# Patient Record
Sex: Female | Born: 1965 | Race: Black or African American | Hispanic: No | Smoking: Never smoker
Health system: Southern US, Community
[De-identification: ages and names within clinical notes are randomized; demographics above are authoritative.]

## PROBLEM LIST (undated history)

## (undated) DIAGNOSIS — F419 Anxiety disorder, unspecified: Secondary | ICD-10-CM

## (undated) DIAGNOSIS — M797 Fibromyalgia: Secondary | ICD-10-CM

## (undated) DIAGNOSIS — R7303 Prediabetes: Secondary | ICD-10-CM

## (undated) DIAGNOSIS — Z8669 Personal history of other diseases of the nervous system and sense organs: Secondary | ICD-10-CM

## (undated) DIAGNOSIS — F329 Major depressive disorder, single episode, unspecified: Secondary | ICD-10-CM

## (undated) DIAGNOSIS — T4145XA Adverse effect of unspecified anesthetic, initial encounter: Secondary | ICD-10-CM

## (undated) DIAGNOSIS — T8859XA Other complications of anesthesia, initial encounter: Secondary | ICD-10-CM

## (undated) DIAGNOSIS — F32A Depression, unspecified: Secondary | ICD-10-CM

## (undated) DIAGNOSIS — R51 Headache: Secondary | ICD-10-CM

## (undated) HISTORY — DX: Personal history of other diseases of the nervous system and sense organs: Z86.69

## (undated) HISTORY — PX: TUBAL LIGATION: SHX77

## (undated) HISTORY — DX: Fibromyalgia: M79.7

## (undated) HISTORY — DX: Depression, unspecified: F32.A

## (undated) HISTORY — DX: Anxiety disorder, unspecified: F41.9

## (undated) HISTORY — DX: Prediabetes: R73.03

## (undated) HISTORY — DX: Major depressive disorder, single episode, unspecified: F32.9

---
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
## 2000-06-10 ENCOUNTER — Other Ambulatory Visit: Admission: RE | Admit: 2000-06-10 | Discharge: 2000-06-10 | Payer: Self-pay | Admitting: *Deleted

## 2003-06-04 ENCOUNTER — Emergency Department (HOSPITAL_COMMUNITY): Admission: EM | Admit: 2003-06-04 | Discharge: 2003-06-04 | Payer: Self-pay | Admitting: Emergency Medicine

## 2004-01-27 ENCOUNTER — Other Ambulatory Visit: Admission: RE | Admit: 2004-01-27 | Discharge: 2004-01-27 | Payer: Self-pay | Admitting: Obstetrics and Gynecology

## 2004-02-17 ENCOUNTER — Ambulatory Visit (HOSPITAL_COMMUNITY): Admission: RE | Admit: 2004-02-17 | Discharge: 2004-02-17 | Payer: Self-pay | Admitting: Obstetrics and Gynecology

## 2005-02-09 ENCOUNTER — Ambulatory Visit (HOSPITAL_COMMUNITY): Admission: RE | Admit: 2005-02-09 | Discharge: 2005-02-09 | Payer: Self-pay | Admitting: Obstetrics and Gynecology

## 2006-11-01 ENCOUNTER — Ambulatory Visit (HOSPITAL_COMMUNITY): Admission: RE | Admit: 2006-11-01 | Discharge: 2006-11-01 | Payer: Self-pay | Admitting: Obstetrics and Gynecology

## 2006-11-05 ENCOUNTER — Encounter: Admission: RE | Admit: 2006-11-05 | Discharge: 2006-11-05 | Payer: Self-pay | Admitting: Obstetrics and Gynecology

## 2006-12-23 ENCOUNTER — Ambulatory Visit: Payer: Self-pay | Admitting: Internal Medicine

## 2006-12-26 ENCOUNTER — Ambulatory Visit: Payer: Self-pay | Admitting: Internal Medicine

## 2007-10-15 IMAGING — US US ABDOMEN COMPLETE
1 series · 14 of 25 positions shown · non-contrast
Comparison: none

CLINICAL DATA: Pelvic pain with generalized abdominal pain.  Family history of ovarian, pancreatic, and cervical cancer. 
 ABDOMEN ULTRASOUND:
TECHNIQUE: Complete abdominal ultrasound examination was performed including evaluation of the liver, gallbladder, bile ducts, pancreas, kidneys, spleen, IVC, and abdominal aorta.
 There is no evidence of gallstones or biliary ductal dilatation.  Gallbladder wall thickness 2 mm.  Common bile duct 5 mm.  The liver is within normal limits in echogenicity, and no focal liver lesions are seen.  The visualized portions of the IVC and pancreas are unremarkable.  Spleen 7.8 cm long.  There is no evidence of splenomegaly.  Right kidney 10.2 cm long, left kidney 11.1 cm long.  The kidneys are unremarkable, and there is no evidence of hydronephrosis.    Either slight right upper pole right renal caliectasis or parapelvic subcentimeter cyst is noted.  The abdominal aorta is nondilated with maximum diameter of 2.1 cm.

[Series 1: us abdomen complete · 0.27mm/px · 14 of 91 slices shown]
[im 1/91]
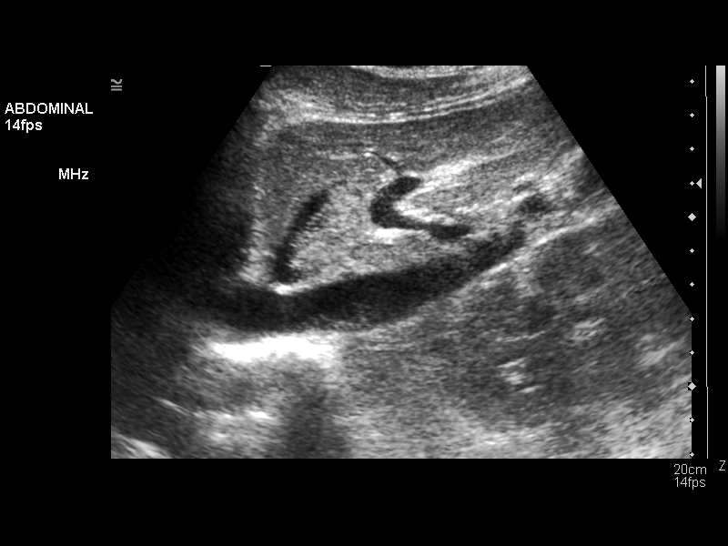
[im 8/91]
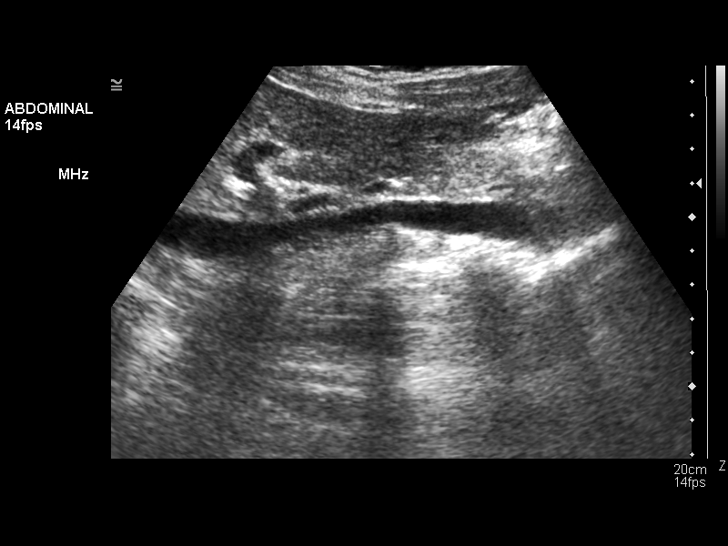
[im 16/91]
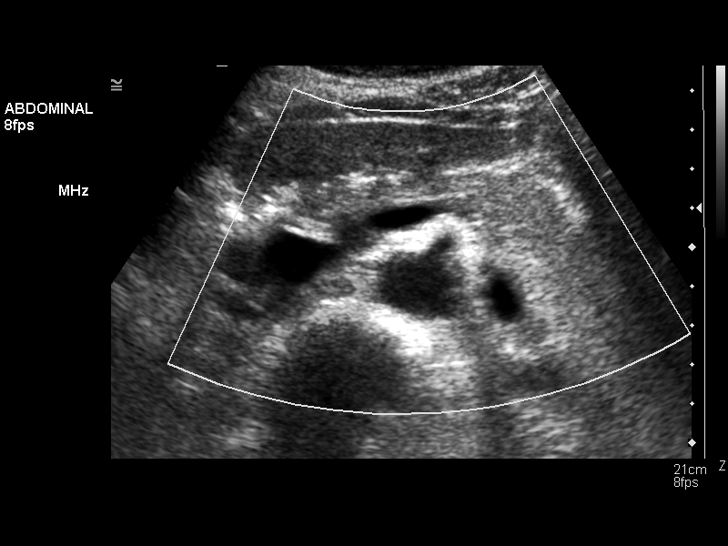
[im 23/91]
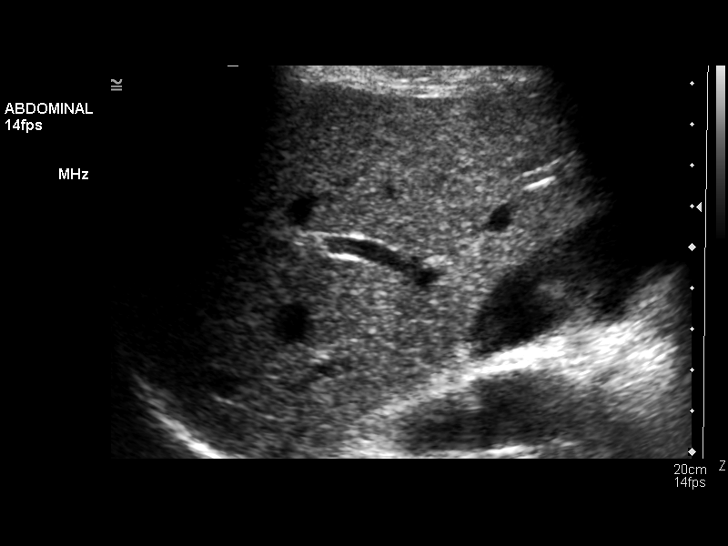
[im 31/91]
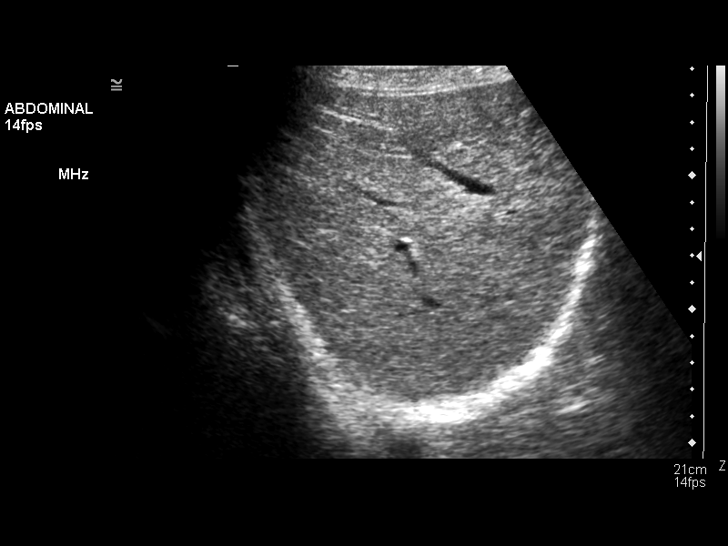
[im 34/91]
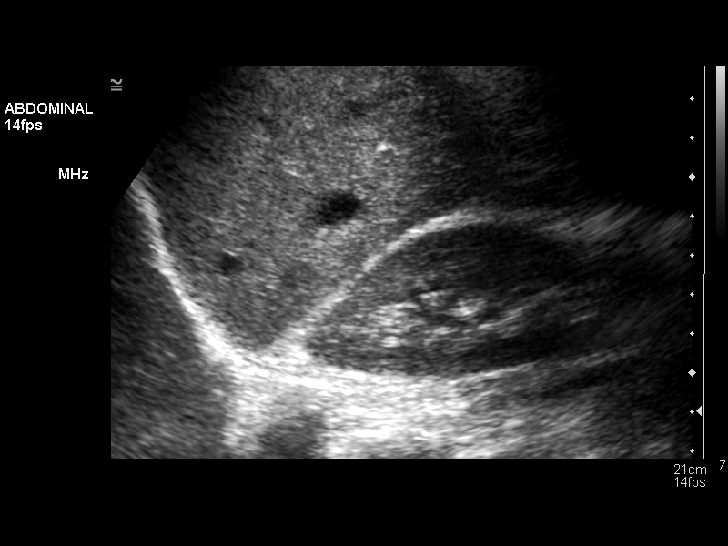
[im 42/91]
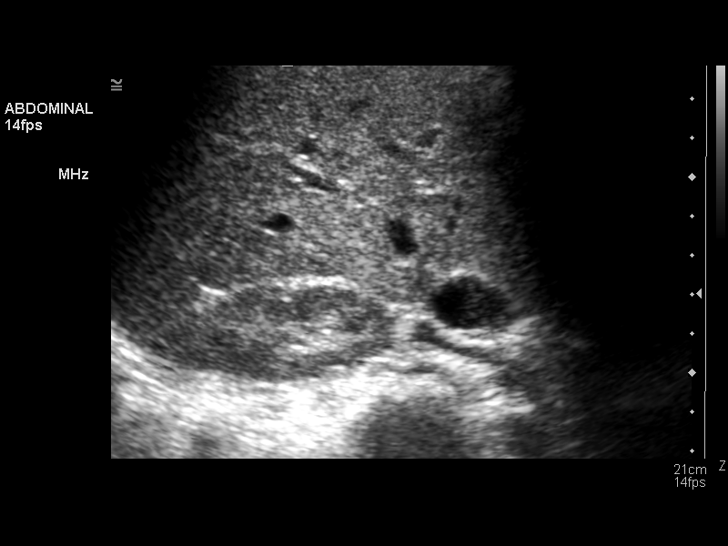
[im 49/91]
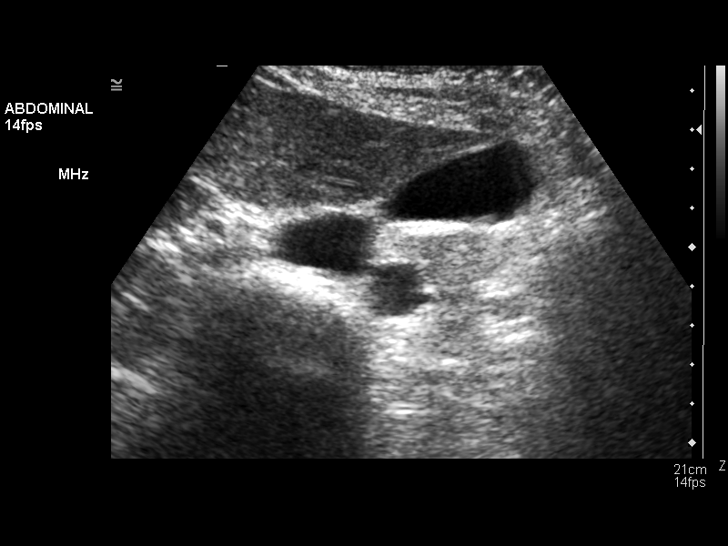
[im 57/91]
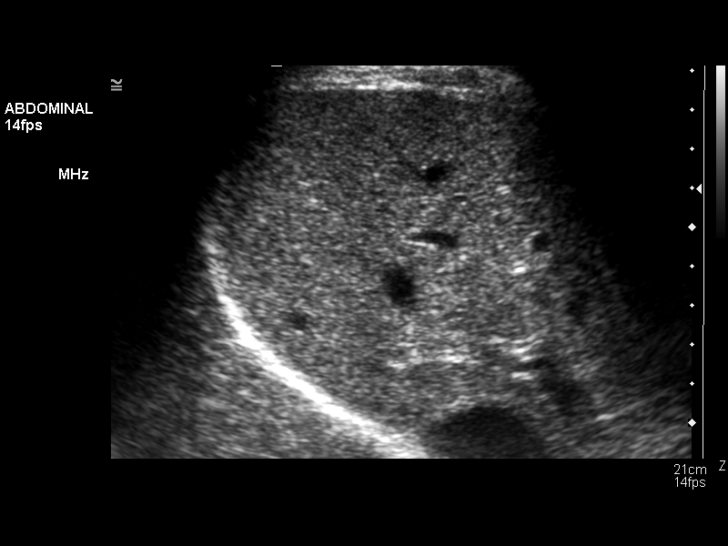
[im 61/91]
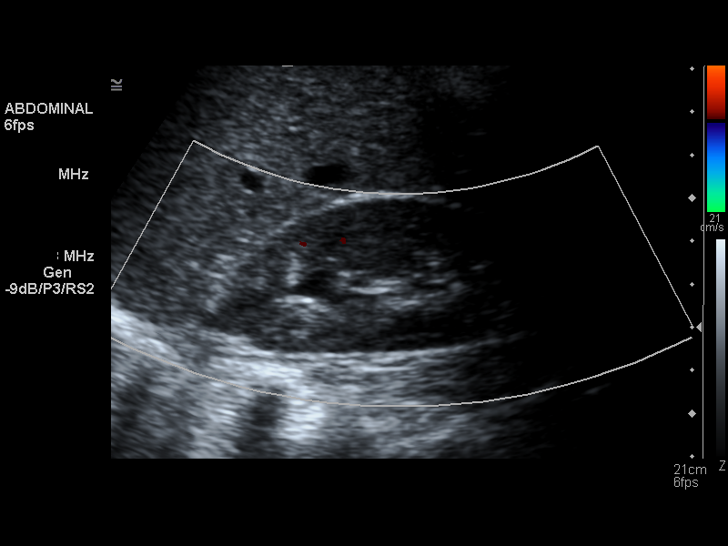
[im 68/91]
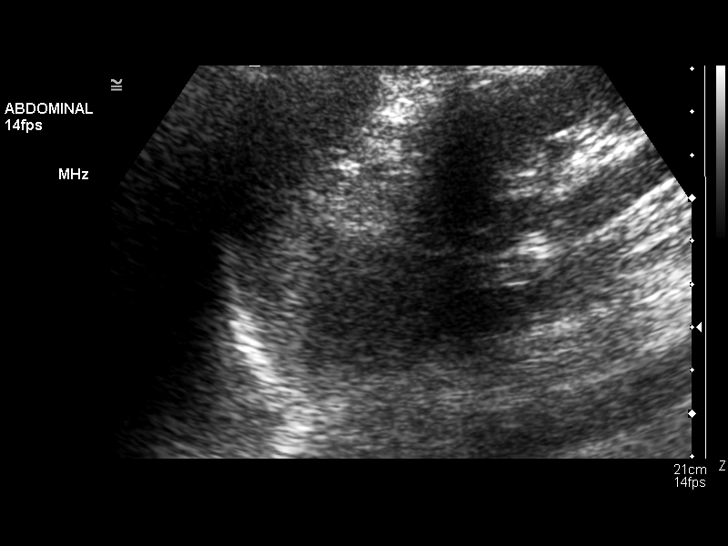
[im 76/91]
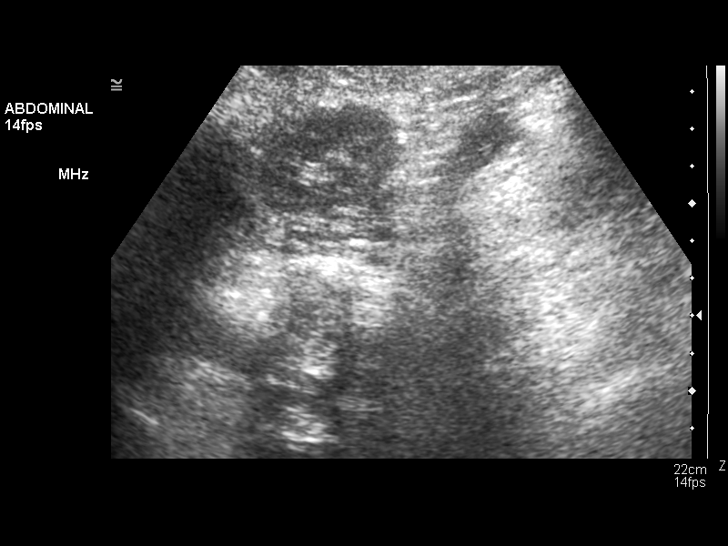
[im 83/91]
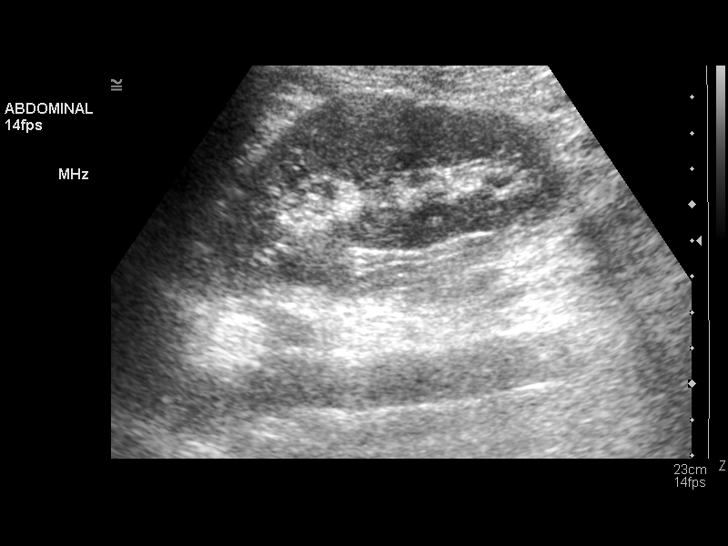
[im 91/91]
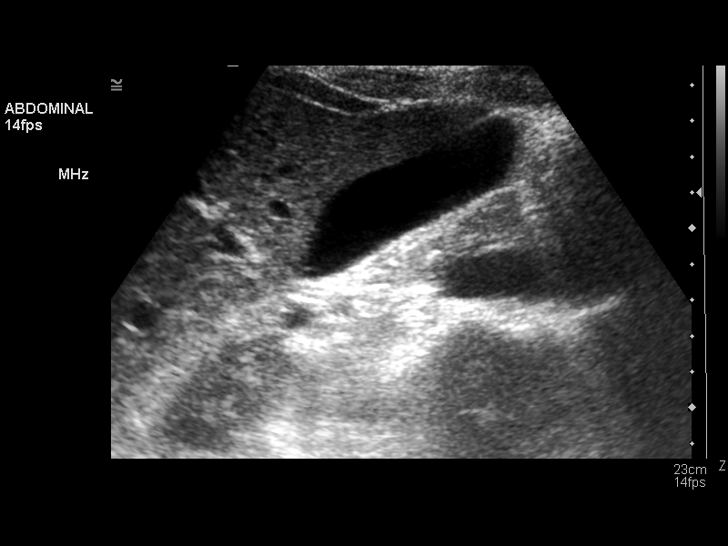

[14 of 25 positions shown; findings below may reference images not displayed]

IMPRESSION: 1. Slight upper pole right renal caliectasis or subcentimeter parapelvic renal cyst.
 2.  Otherwise negative abdominal ultrasound.

## 2007-10-15 IMAGING — US US PELVIS COMPLETE
1 series · 13 of 25 positions shown · non-contrast
Comparison: [HOSPITAL] pelvic ultrasound, 02/17/04.

CLINICAL DATA: Pelvic pain.  Heavy long menstruation.    
 TRANSABDOMINAL AND TRANSVAGINAL PELVIC ULTRASOUND:
TECHNIQUE: Both transabdominal and transvaginal ultrasound examinations of the pelvis were performed including evaluation of the uterus, ovaries, adnexal regions, and pelvic cul-de-sac.

[Series 1: us pelvis complete · 0.22mm/px · 13 of 79 slices shown]
[im 1/79]
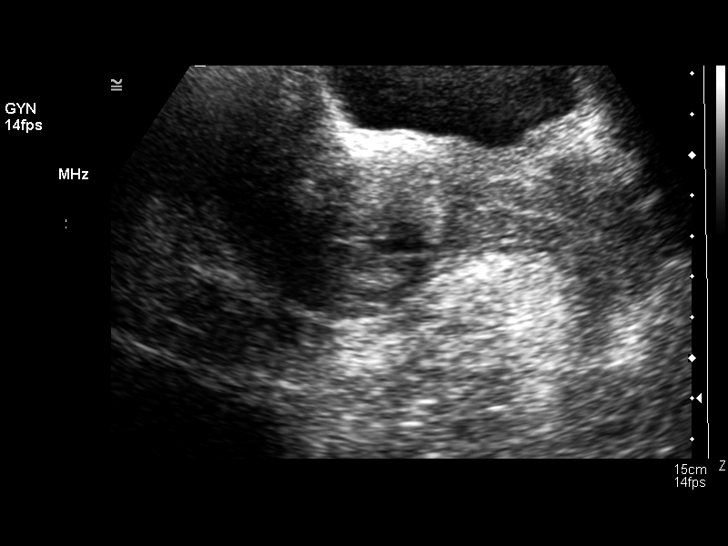
[im 7/79]
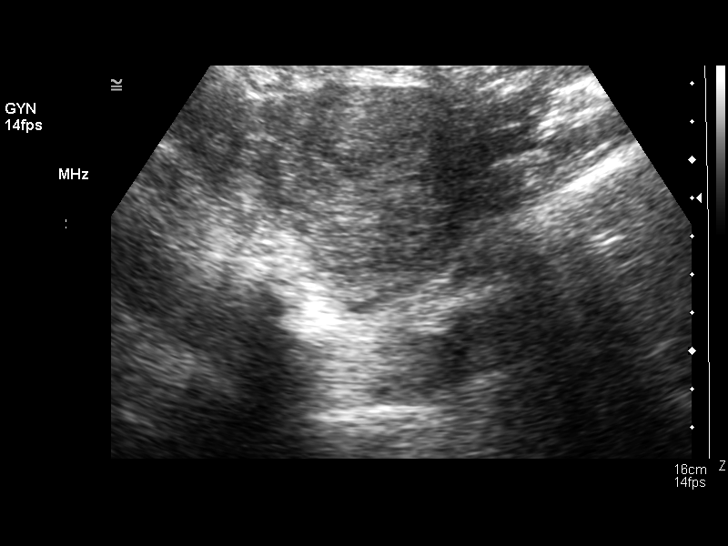
[im 14/79]
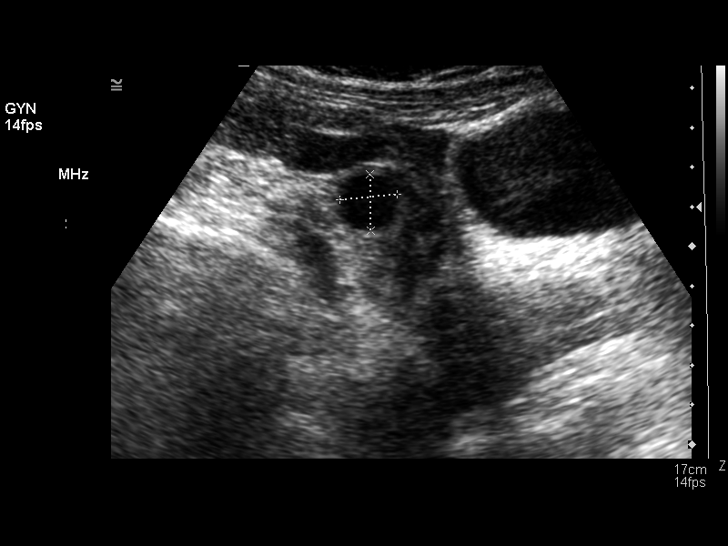
[im 20/79]
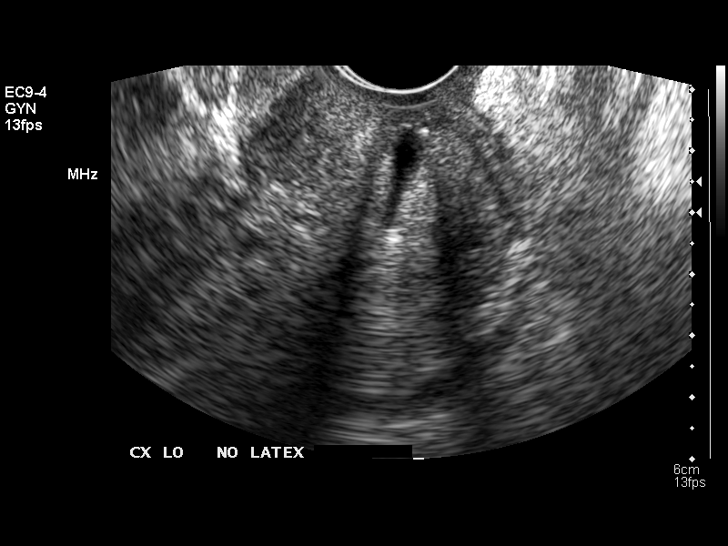
[im 27/79]
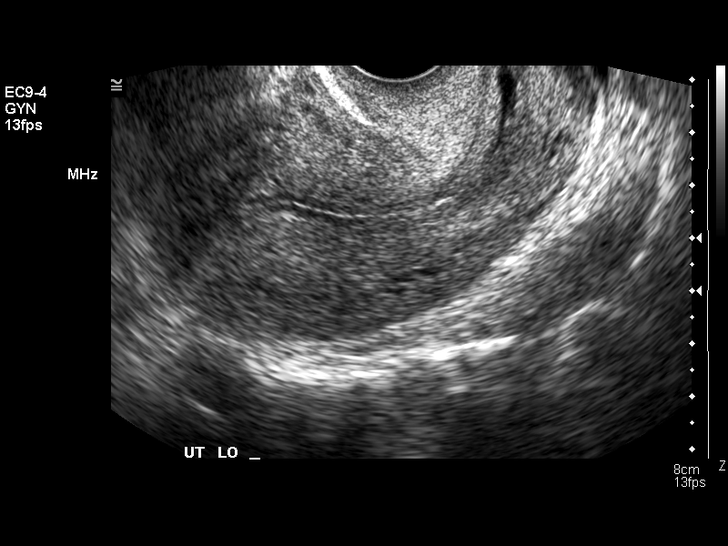
[im 33/79]
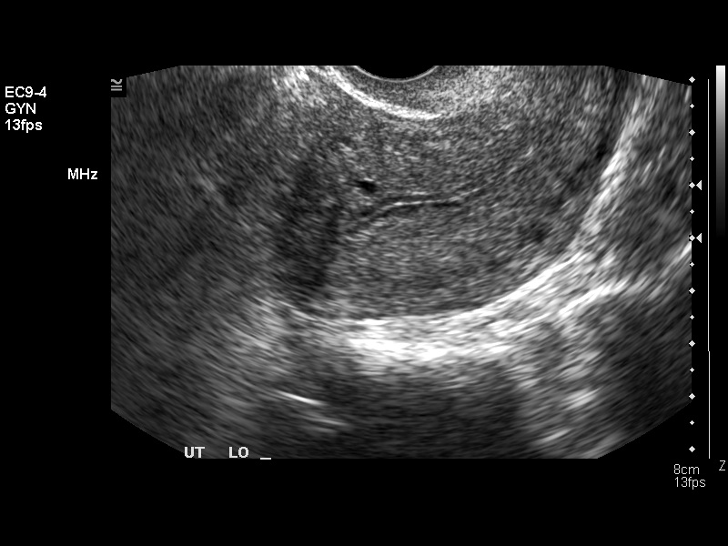
[im 40/79]
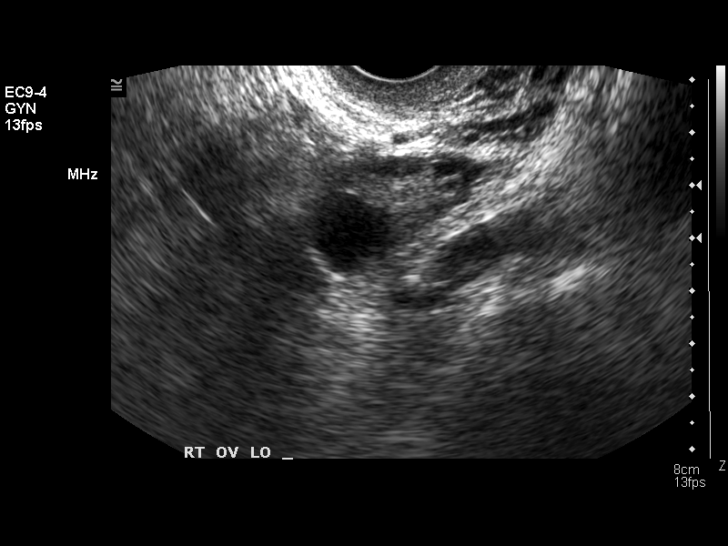
[im 46/79]
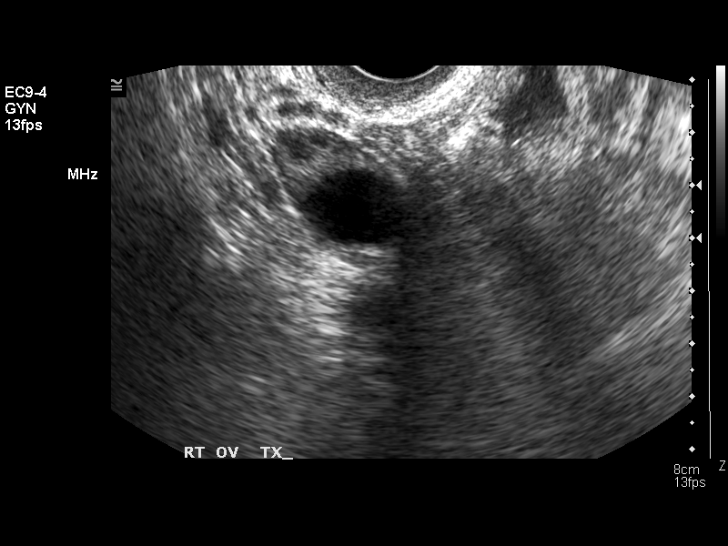
[im 53/79]
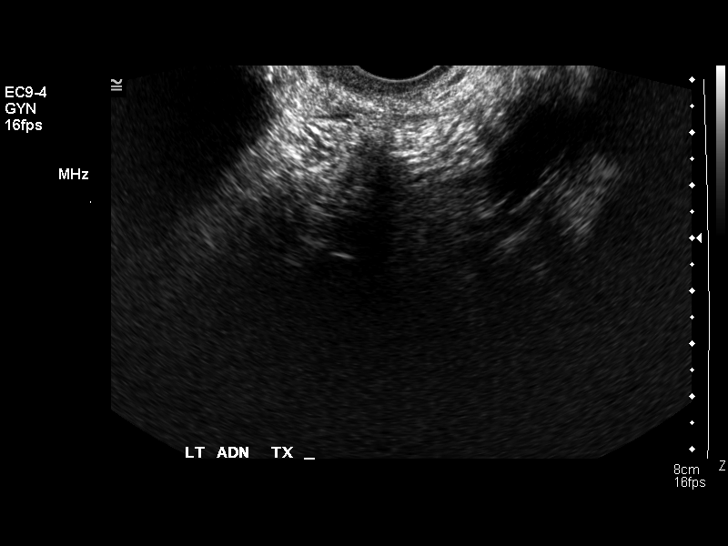
[im 59/79]
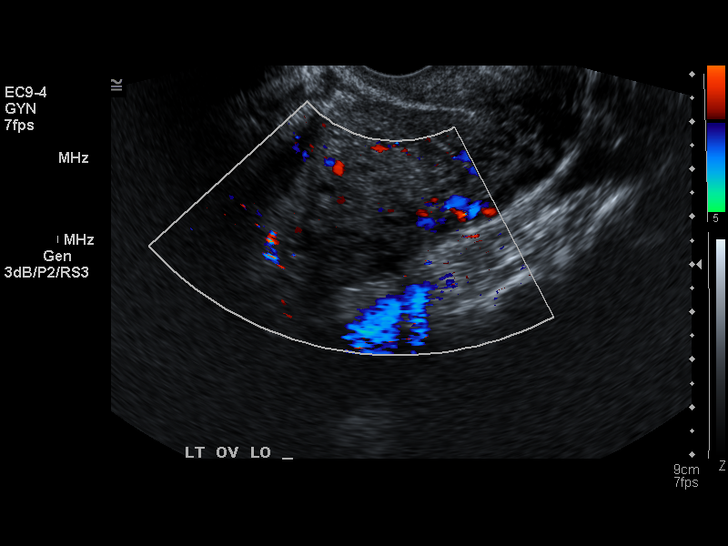
[im 66/79]
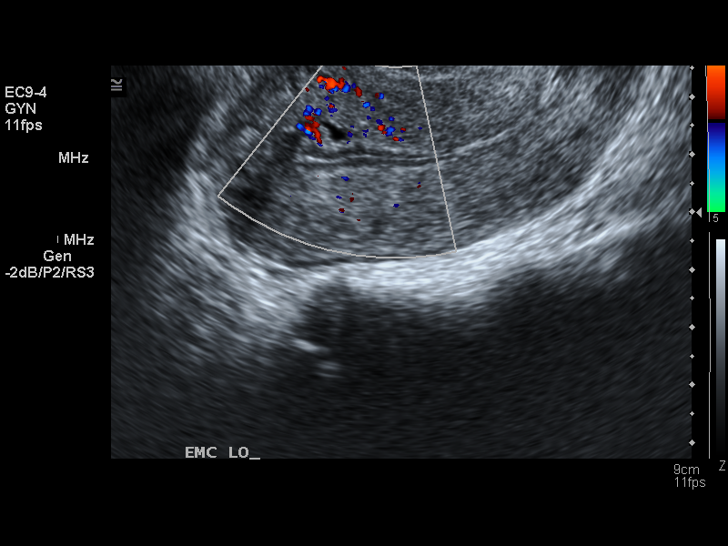
[im 72/79]
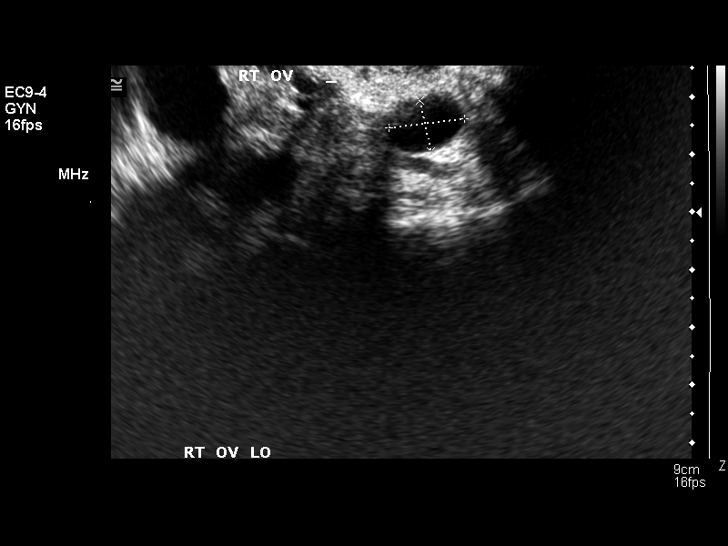
[im 79/79]
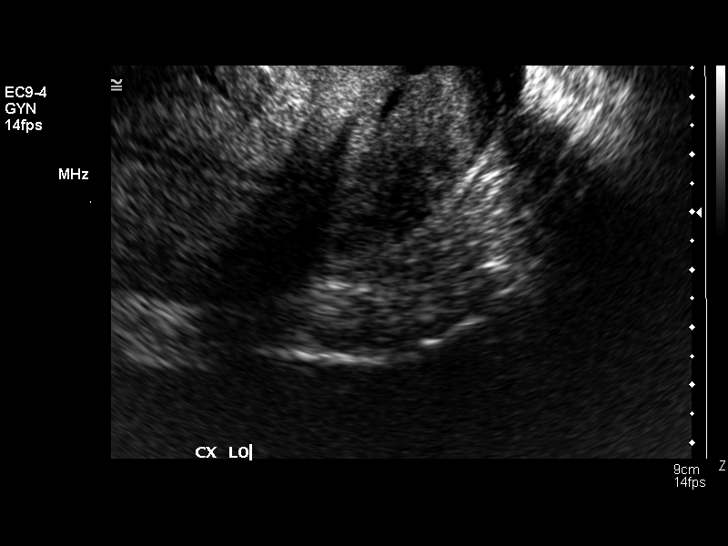

[13 of 25 positions shown; findings below may reference images not displayed]

FINDINGS: The uterus remains normal in size measuring 8.7 cm long x 4.4 cm AP x 5.5 cm wide with fundal endometrial stripe thickness of 9 mm normal for a menstruating female.  Interval 5 mm benign appearing cyst is seen at the anterior junctional zone interposed between endometrium and submucosal myometrium.  Tiny cul de sac free fluid transvaginally only favors physiologic ovulatory fluid.  The bilateral ovaries are normal in size with the right measuring 3.7 cm long x 2 cm AP x 2.4 cm wide and the left 2.5 cm long x 1.3 cm AP x 1.6 cm wide.  Previous 1.2 cm right paraovarian cyst is essentially stable currently measuring 1.3 cm long x 0.9 cm AP x 1 cm wide.  Interval 1.8 cm simple right ovarian cyst is also noted.
IMPRESSION: 1.  Interval 5 mm likely benign cyst at the anterior uterine junctional zone.
 2.  Likely physiologic free fluid seen transvaginally only.
 3.  Stable 1.3 cm paraovarian cyst with interval 2 cm simple right ovarian cyst. 
 4.  Otherwise negative.

## 2007-11-06 ENCOUNTER — Ambulatory Visit (HOSPITAL_COMMUNITY): Admission: RE | Admit: 2007-11-06 | Discharge: 2007-11-06 | Payer: Self-pay | Admitting: Obstetrics and Gynecology

## 2008-07-28 ENCOUNTER — Ambulatory Visit: Payer: Self-pay | Admitting: Family Medicine

## 2008-07-30 ENCOUNTER — Ambulatory Visit: Payer: Self-pay | Admitting: Family Medicine

## 2008-08-04 ENCOUNTER — Encounter: Admission: RE | Admit: 2008-08-04 | Discharge: 2008-08-04 | Payer: Self-pay | Admitting: Family Medicine

## 2008-08-11 ENCOUNTER — Ambulatory Visit: Payer: Self-pay | Admitting: Family Medicine

## 2008-08-18 ENCOUNTER — Ambulatory Visit: Payer: Self-pay | Admitting: Family Medicine

## 2008-09-08 ENCOUNTER — Ambulatory Visit: Payer: Self-pay | Admitting: Family Medicine

## 2008-10-25 ENCOUNTER — Ambulatory Visit: Payer: Self-pay | Admitting: Family Medicine

## 2008-12-07 ENCOUNTER — Ambulatory Visit (HOSPITAL_COMMUNITY): Admission: RE | Admit: 2008-12-07 | Discharge: 2008-12-07 | Payer: Self-pay | Admitting: Obstetrics and Gynecology

## 2009-01-27 ENCOUNTER — Ambulatory Visit: Payer: Self-pay | Admitting: Family Medicine

## 2009-01-31 ENCOUNTER — Ambulatory Visit: Payer: Self-pay | Admitting: Family Medicine

## 2009-03-12 ENCOUNTER — Emergency Department (HOSPITAL_COMMUNITY): Admission: EM | Admit: 2009-03-12 | Discharge: 2009-03-12 | Payer: Self-pay | Admitting: Emergency Medicine

## 2009-07-05 ENCOUNTER — Ambulatory Visit: Payer: Self-pay | Admitting: Family Medicine

## 2009-12-14 ENCOUNTER — Ambulatory Visit: Payer: Self-pay | Admitting: Physician Assistant

## 2010-02-28 ENCOUNTER — Ambulatory Visit: Payer: Self-pay | Admitting: Physician Assistant

## 2010-12-08 ENCOUNTER — Ambulatory Visit
Admission: RE | Admit: 2010-12-08 | Discharge: 2010-12-08 | Payer: Self-pay | Source: Home / Self Care | Attending: Family Medicine | Admitting: Family Medicine

## 2010-12-17 ENCOUNTER — Encounter: Payer: Self-pay | Admitting: Obstetrics and Gynecology

## 2011-02-07 ENCOUNTER — Ambulatory Visit (INDEPENDENT_AMBULATORY_CARE_PROVIDER_SITE_OTHER): Payer: Medicaid Other | Admitting: Family Medicine

## 2011-02-07 DIAGNOSIS — B009 Herpesviral infection, unspecified: Secondary | ICD-10-CM

## 2011-02-07 DIAGNOSIS — IMO0001 Reserved for inherently not codable concepts without codable children: Secondary | ICD-10-CM

## 2011-04-13 NOTE — Assessment & Plan Note (Signed)
[facility]                         GASTROENTEROLOGY OFFICE NOTE   NAME:[name], [name]                       MRN:          [id number]  DATE:12/23/2006                            DOB:          [birth year]    Ms. [name] is a very nice 45 year old African American female with  epigastric of 4 months duration. The pain started during Thanksgiving.  She woke up with pain radiating to the back and around her costal  margin. Also, into the chest. She borrowed Nexium from her cousin and  she had some relief of the abdominal pain. Nevertheless, the pain has  continued on-and-off. It is definitely worse with eating and with  stress. She usually does not eat the first part of the day, but as the  day progresses, pain evolves. The patient does not take any NSAIDs or  aspirin. She used to take a lot of aspirin, but no longer. Her  grandmother had pancreatic cancer. Mrs. [name] had an upper abdominal  ultrasound, which was negative. There is no family history of  gallbladder disease.   MEDICATIONS:  Topamax 100 mg at bedtime. This was started to control her  headaches. She is quite satisfied with the medication.   Her weight has been stable. She denies vomiting, nausea or dysphagia.  Her bowels habits have been regular. She had a colonoscopy in 2000.   PAST MEDICAL HISTORY:  Significant for:  1. Chronic headaches.  2. Depression.  3. She had tubal ligation in 1998.   FAMILY HISTORY:  Mother has uterine cancer. Grandmother had pancreatic  cancer. Uncle had prostatic cancer.   SOCIAL HISTORY:  The patient is divorced with 2 children, 10 and 29. 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 She also works full-time. She  does not smoke and drinks alcohol socially.   REVIEW OF SYSTEMS:  Positive for allergies, back pain, vision changes.   PHYSICAL EXAMINATION:  Blood pressure 110/76, pulse 84 and weight 137  pounds. She was alert, oriented and in no  distress.  Her sclerae were nonicteric.  NECK: Supple without adenopathy.  LUNGS: Clear to auscultation.  COR: Normal S1, normal S2.  ABDOMEN: Soft, relaxed with normoactive bowel sounds. Liver edge was at  costal margin. There was tenderness in the mid-epigastrium, above the  umbilicus and subxiphoid area, which did not seem to radiate to left  upper quadrant. There was no costovertebral angle tenderness  bilaterally. Lower abdomen was unremarkable.  EXTREMITIES: No edema.  RECTAL: Examination not done today.   IMPRESSION:  A 45 year old female with now 107-month history of persistent  epigastric pain with some question of partial relief with Nexium, who  has negative upper abdominal ultrasound with no evidence of  cholelithiasis. I think her symptoms are suggestive of either peptic  ulcer disease or irritable bowel syndrome with gastric spasm. She may  have a non-ulcerative dyspepsia,  H-pylori gastritis.   PLAN:  1. Upper endoscopy has been scheduled.  2. Nexium 40 mg daily. Samples given as well as prescription.      Depending on the response and the results of  the upper endoscopy,      we may need further investigation, specifically with a CT scan of      the abdomen.     Hedwig Morton. Juanda Chance, MD  Electronically Signed    DMB/MedQ  DD: 12/23/2006  DT: 12/23/2006  Job #: [id number]   cc:   Maxie Better, M.D.

## 2011-04-16 ENCOUNTER — Other Ambulatory Visit: Payer: Self-pay | Admitting: Family Medicine

## 2011-04-16 NOTE — Telephone Encounter (Signed)
ambien 10mg  #20 with 6 refills and tramadol 50mg  #30 with 6 refills

## 2011-04-16 NOTE — Telephone Encounter (Signed)
ok renew Ambien and tramadol. Give first 6 months renewals

## 2011-04-20 ENCOUNTER — Emergency Department (HOSPITAL_COMMUNITY): Payer: Self-pay

## 2011-04-20 ENCOUNTER — Emergency Department (HOSPITAL_COMMUNITY)
Admission: EM | Admit: 2011-04-20 | Discharge: 2011-04-20 | Disposition: A | Discharge status: Home / Self Care | Payer: Self-pay | Attending: Emergency Medicine | Admitting: Emergency Medicine

## 2011-04-20 DIAGNOSIS — N201 Calculus of ureter: Secondary | ICD-10-CM | POA: Insufficient documentation

## 2011-04-20 DIAGNOSIS — N898 Other specified noninflammatory disorders of vagina: Secondary | ICD-10-CM | POA: Insufficient documentation

## 2011-04-20 DIAGNOSIS — N949 Unspecified condition associated with female genital organs and menstrual cycle: Secondary | ICD-10-CM | POA: Insufficient documentation

## 2011-04-20 DIAGNOSIS — R109 Unspecified abdominal pain: Secondary | ICD-10-CM | POA: Insufficient documentation

## 2011-04-20 LAB — DIFFERENTIAL
Basophils Absolute: 0 10*3/uL (ref 0.0–0.1)
Basophils Relative: 1 % (ref 0–1)
Eosinophils Absolute: 0.2 10*3/uL (ref 0.0–0.7)
Eosinophils Relative: 3 % (ref 0–5)
Lymphocytes Relative: 28 % (ref 12–46)
Lymphs Abs: 1.7 10*3/uL (ref 0.7–4.0)
Monocytes Absolute: 0.5 10*3/uL (ref 0.1–1.0)
Monocytes Relative: 8 % (ref 3–12)
Neutro Abs: 3.8 10*3/uL (ref 1.7–7.7)
Neutrophils Relative %: 61 % (ref 43–77)

## 2011-04-20 LAB — BASIC METABOLIC PANEL
BUN: 19 mg/dL (ref 6–23)
CO2: 23 mEq/L (ref 19–32)
Calcium: 9.4 mg/dL (ref 8.4–10.5)
Chloride: 106 mEq/L (ref 96–112)
Creatinine, Ser: 0.77 mg/dL (ref 0.4–1.2)
GFR calc Af Amer: >60 mL/min (ref >60)
GFR calc non Af Amer: >60 mL/min (ref >60)
Glucose, Bld: 89 mg/dL (ref 70–99)
Potassium: 3.6 mEq/L (ref 3.5–5.1)
Sodium: 139 mEq/L (ref 135–145)

## 2011-04-20 LAB — URINALYSIS, ROUTINE W REFLEX MICROSCOPIC
Bilirubin Urine: NEGATIVE
Glucose, UA: NEGATIVE mg/dL
Nitrite: NEGATIVE
Protein, ur: NEGATIVE mg/dL
Specific Gravity, Urine: 1.025 (ref 1.005–1.030)
Urobilinogen, UA: 0.2 mg/dL (ref 0.0–1.0)
pH: 5.5 (ref 5.0–8.0)

## 2011-04-20 LAB — CBC
HCT: 38.6 % (ref 36.0–46.0)
Hemoglobin: 12.6 g/dL (ref 12.0–15.0)
MCH: 27.5 pg (ref 26.0–34.0)
MCHC: 32.6 g/dL (ref 30.0–36.0)
MCV: 84.1 fL (ref 78.0–100.0)
Platelets: 196 10*3/uL (ref 150–400)
RBC: 4.59 MIL/uL (ref 3.87–5.11)
RDW: 14.2 % (ref 11.5–15.5)
WBC: 6.3 10*3/uL (ref 4.0–10.5)

## 2011-04-20 LAB — POCT PREGNANCY, URINE: Preg Test, Ur: NEGATIVE

## 2011-04-20 LAB — URINE MICROSCOPIC-ADD ON

## 2011-04-20 LAB — WET PREP, GENITAL
Trich, Wet Prep: NONE SEEN
Yeast Wet Prep HPF POC: NONE SEEN

## 2011-04-21 LAB — GC/CHLAMYDIA PROBE AMP, GENITAL
Chlamydia, DNA Probe: NEGATIVE
GC Probe Amp, Genital: NEGATIVE

## 2011-09-03 ENCOUNTER — Encounter: Payer: Self-pay | Admitting: Family Medicine

## 2011-09-03 ENCOUNTER — Other Ambulatory Visit: Payer: Self-pay | Admitting: Family Medicine

## 2011-09-03 DIAGNOSIS — Z1231 Encounter for screening mammogram for malignant neoplasm of breast: Secondary | ICD-10-CM

## 2011-09-04 DIAGNOSIS — Z0279 Encounter for issue of other medical certificate: Secondary | ICD-10-CM

## 2011-09-10 ENCOUNTER — Ambulatory Visit
Admission: RE | Admit: 2011-09-10 | Discharge: 2011-09-10 | Disposition: A | Discharge status: Home / Self Care | Payer: BC Managed Care – PPO | Source: Ambulatory Visit | Attending: Family Medicine | Admitting: Family Medicine

## 2011-09-10 ENCOUNTER — Encounter: Payer: Self-pay | Admitting: Family Medicine

## 2011-09-10 ENCOUNTER — Ambulatory Visit (INDEPENDENT_AMBULATORY_CARE_PROVIDER_SITE_OTHER): Payer: BC Managed Care – PPO | Admitting: Family Medicine

## 2011-09-10 VITALS — BP 120/80 | HR 95 | Wt 170.0 lb

## 2011-09-10 DIAGNOSIS — Z566 Other physical and mental strain related to work: Secondary | ICD-10-CM

## 2011-09-10 DIAGNOSIS — Z1231 Encounter for screening mammogram for malignant neoplasm of breast: Secondary | ICD-10-CM

## 2011-09-10 DIAGNOSIS — Z23 Encounter for immunization: Secondary | ICD-10-CM

## 2011-09-10 DIAGNOSIS — M797 Fibromyalgia: Secondary | ICD-10-CM

## 2011-09-10 DIAGNOSIS — Z5689 Other problems related to employment: Secondary | ICD-10-CM

## 2011-09-10 DIAGNOSIS — F32A Depression, unspecified: Secondary | ICD-10-CM

## 2011-09-10 DIAGNOSIS — F329 Major depressive disorder, single episode, unspecified: Secondary | ICD-10-CM

## 2011-09-10 DIAGNOSIS — IMO0001 Reserved for inherently not codable concepts without codable children: Secondary | ICD-10-CM

## 2011-09-10 MED ORDER — CYCLOBENZAPRINE HCL 10 MG PO TABS: 10.0000 mg | ORAL_TABLET | Freq: Every day | ORAL | Status: DC

## 2011-09-10 MED ORDER — DULOXETINE HCL 60 MG PO CPEP: 60.0000 mg | ORAL_CAPSULE | Freq: Every day | ORAL | Status: DC

## 2011-09-10 NOTE — Progress Notes (Signed)
  Subjective:    Patient ID: [name], female    DOB: [birth year], 45 y.o.   MRN: [id number]  HPI She is here for a recheck. She continues on medications listed in the chart. She has stopped Cymbalta and states that she has noted no change in her fibromyalgia symptoms. She still does have difficulty controlling her emotions and feels that she does at times have depression. She also continues on her Valtrex for treatment of herpes genitalis and she has not had an outbreak recently. She does have difficulty dealing with stress and her emotions. She especially notes this at work and sometimes does feel quite overwhelmed.   Review of Systems     Objective:   Physical Exam Alert and in no distress with appropriate affect.       Assessment & Plan:   1. Fibromyalgia muscle pain   2. Depression (emotion)   3. Stress at work    we discussed her fibromyalgia in terms of her medications. I will place her back on Cymbalta increasing it to 60 mg. Recheck in approximately 6 weeks and go to 90 and then possibly to the 120. Discussed stress and stress management with her. Encouraged her to change how she responds to stressful situations. Recommended counseling or doing some individual reading about this.

## 2011-09-10 NOTE — Patient Instructions (Signed)
Think about what I said about stress and stress management. See you back here in 6 weeks.

## 2011-09-26 ENCOUNTER — Telehealth: Payer: Self-pay | Admitting: Family Medicine

## 2011-09-26 NOTE — Telephone Encounter (Signed)
A one-week sample of Cymbalta was given to the patient.

## 2011-09-26 NOTE — Telephone Encounter (Signed)
Pt was called and informed that samples are available for her to pick up

## 2011-10-15 ENCOUNTER — Other Ambulatory Visit: Payer: Self-pay | Admitting: Family Medicine

## 2011-10-15 NOTE — Telephone Encounter (Signed)
Is this okay?

## 2011-10-15 NOTE — Telephone Encounter (Signed)
Phone in the Ambien

## 2011-10-15 NOTE — Telephone Encounter (Signed)
Called in ambien to pharmacy

## 2011-10-24 ENCOUNTER — Encounter: Payer: Self-pay | Admitting: Family Medicine

## 2011-10-24 ENCOUNTER — Ambulatory Visit (INDEPENDENT_AMBULATORY_CARE_PROVIDER_SITE_OTHER): Payer: BC Managed Care – PPO | Admitting: Family Medicine

## 2011-10-24 VITALS — BP 118/72 | HR 60 | Wt 172.0 lb

## 2011-10-24 DIAGNOSIS — IMO0001 Reserved for inherently not codable concepts without codable children: Secondary | ICD-10-CM

## 2011-10-24 DIAGNOSIS — M797 Fibromyalgia: Secondary | ICD-10-CM

## 2011-10-24 DIAGNOSIS — F329 Major depressive disorder, single episode, unspecified: Secondary | ICD-10-CM

## 2011-10-24 DIAGNOSIS — F32A Depression, unspecified: Secondary | ICD-10-CM

## 2011-10-24 MED ORDER — DULOXETINE HCL 60 MG PO CPEP: 60.0000 mg | ORAL_CAPSULE | Freq: Every day | ORAL | Status: DC

## 2011-10-24 MED ORDER — DULOXETINE HCL 30 MG PO CPEP: 30.0000 mg | ORAL_CAPSULE | Freq: Every day | ORAL | Status: DC

## 2011-10-24 NOTE — Patient Instructions (Signed)
Let me know if the cost of the Cymbalta is prohibitive.

## 2011-10-24 NOTE — Progress Notes (Signed)
  Subjective:    Patient ID: [name], female    DOB: [birth year], 45 y.o.   MRN: [id number]  HPI She is here for recheck. She states that she still is on an emotional roller coaster. She is concerned that this could be related to her cycles. She has been irregular for the last year sometimes having 2 menses per month and sometimes missing. Her bleeding can also be erratic. Her fibromyalgia pains do get worse during her cycle.   Review of Systems     Objective:   Physical Exam Alert and in no distress with appropriate affect otherwise not examined       Assessment & Plan:  Fibromyalgia. Depression. Increase Cymbalta to 90 mg. She will check on the cost this medication. May consider using Prozac to help with possible perimenopausal symptoms

## 2011-11-02 ENCOUNTER — Other Ambulatory Visit: Payer: Self-pay | Admitting: Family Medicine

## 2011-11-02 NOTE — Telephone Encounter (Signed)
Is this ok?

## 2011-11-21 ENCOUNTER — Ambulatory Visit (INDEPENDENT_AMBULATORY_CARE_PROVIDER_SITE_OTHER): Payer: BC Managed Care – PPO | Admitting: Family Medicine

## 2011-11-21 ENCOUNTER — Encounter: Payer: Self-pay | Admitting: Family Medicine

## 2011-11-21 VITALS — BP 110/70 | HR 111 | Wt 170.0 lb

## 2011-11-21 DIAGNOSIS — IMO0001 Reserved for inherently not codable concepts without codable children: Secondary | ICD-10-CM

## 2011-11-21 DIAGNOSIS — M797 Fibromyalgia: Secondary | ICD-10-CM

## 2011-11-21 MED ORDER — VENLAFAXINE HCL ER 37.5 MG PO CP24: 37.5000 mg | ORAL_CAPSULE | Freq: Every day | ORAL | Status: DC

## 2011-11-21 MED ORDER — VENLAFAXINE HCL 75 MG PO TABS: 75.0000 mg | ORAL_TABLET | Freq: Two times a day (BID) | ORAL | Status: DC

## 2011-11-21 NOTE — Progress Notes (Signed)
  Subjective:    Patient ID: [name], female    DOB: 11/23/1966, 45 y.o.   MRN: [id number]  HPI She is here for consultation. She would like to switch from Cymbalta to Effexor mainly due to cost. She has only been on the Cymbalta for several weeks and has seen very little improvement. She also notes that her menses are becoming more sporadic and irregular as well as decrease bleeding. She continues on medications listed in the chart.   Review of Systems     Objective:   Physical Exam Alert and in no distress otherwise not examined       Assessment & Plan:  Fibromyalgia. Probable perimenopausal I will switch her to Effexor. She will start on 37.5 mg, increasing to 75 twice a day. Recheck with me in several months. Also discussed the perimenopausal symptoms she is having. No therapy at this time

## 2011-11-21 NOTE — Patient Instructions (Signed)
Start with a lower dose of Effexor for the first week and then the higher dose.

## 2011-11-22 ENCOUNTER — Ambulatory Visit: Payer: BC Managed Care – PPO | Admitting: Family Medicine

## 2011-11-27 LAB — HM PAP SMEAR: HM Pap smear: NORMAL

## 2012-01-23 ENCOUNTER — Encounter: Payer: Self-pay | Admitting: Family Medicine

## 2012-01-23 ENCOUNTER — Ambulatory Visit (INDEPENDENT_AMBULATORY_CARE_PROVIDER_SITE_OTHER): Payer: BC Managed Care – PPO | Admitting: Family Medicine

## 2012-01-23 VITALS — BP 112/70 | HR 100 | Ht 63.0 in | Wt 166.0 lb

## 2012-01-23 DIAGNOSIS — M797 Fibromyalgia: Secondary | ICD-10-CM

## 2012-01-23 DIAGNOSIS — IMO0001 Reserved for inherently not codable concepts without codable children: Secondary | ICD-10-CM

## 2012-01-23 MED ORDER — TRAMADOL HCL 50 MG PO TABS: 50.0000 mg | ORAL_TABLET | Freq: Four times a day (QID) | ORAL | Status: DC | PRN

## 2012-01-23 MED ORDER — VENLAFAXINE HCL 100 MG PO TABS: 100.0000 mg | ORAL_TABLET | Freq: Two times a day (BID) | ORAL | Status: DC

## 2012-01-23 MED ORDER — MODAFINIL 100 MG PO TABS: 100.0000 mg | ORAL_TABLET | Freq: Every day | ORAL | Status: DC

## 2012-01-23 NOTE — Patient Instructions (Signed)
I'll call you after I talk to your rheumatologist

## 2012-01-23 NOTE — Progress Notes (Signed)
  Subjective:    Patient ID: [name], female    DOB: 09-07-1966, 46 y.o.   MRN: [id number]  HPI She is here for followup. She was switched to Effexor to help cut down on the cost of her medications. She states she is feeling roughly 80% better. She was given phentermine by her gynecologist initially for weight loss but found that it did help with her energy and with her focus. She would like to be continued on this. She otherwise has no particular concerns or questions.    Review of Systems     Objective:   Physical Exam  Alert and in no distress with appropriate affect      Assessment & Plan:   1. Fibromyalgia muscle pain  traMADol (ULTRAM) 50 MG tablet, venlafaxine (EFFEXOR) 100 MG tablet   I discussed the use of phentermine with her rheumatologist. Dr. Corliss Skains does not use this medication for energy and did recommend Provigil. I will prescribe this. The patient will decide whether to get it filled since it apparently is not a covered medication under her insurance plan. I will also increase her Effexor to 100 mg twice a day.

## 2012-02-11 ENCOUNTER — Other Ambulatory Visit: Payer: Self-pay | Admitting: Family Medicine

## 2012-02-11 NOTE — Telephone Encounter (Signed)
Is this ok?

## 2012-02-11 NOTE — Telephone Encounter (Signed)
This is okay.

## 2012-03-07 ENCOUNTER — Other Ambulatory Visit: Payer: Self-pay | Admitting: Family Medicine

## 2012-03-07 NOTE — Telephone Encounter (Signed)
Called into [facility].

## 2012-03-07 NOTE — Telephone Encounter (Signed)
Call in the Flexeril 

## 2012-03-07 NOTE — Telephone Encounter (Signed)
Is this okay to refill, was rx'd by you at 09/10/11 appt with 5 refills. Thanks.

## 2012-03-11 ENCOUNTER — Ambulatory Visit (INDEPENDENT_AMBULATORY_CARE_PROVIDER_SITE_OTHER): Payer: BC Managed Care – PPO | Admitting: Family Medicine

## 2012-03-11 ENCOUNTER — Encounter: Payer: Self-pay | Admitting: Family Medicine

## 2012-03-11 ENCOUNTER — Telehealth: Payer: Self-pay | Admitting: Internal Medicine

## 2012-03-11 VITALS — BP 110/70 | HR 78 | Wt 168.0 lb

## 2012-03-11 DIAGNOSIS — R5383 Other fatigue: Secondary | ICD-10-CM

## 2012-03-11 DIAGNOSIS — M797 Fibromyalgia: Secondary | ICD-10-CM

## 2012-03-11 DIAGNOSIS — IMO0001 Reserved for inherently not codable concepts without codable children: Secondary | ICD-10-CM

## 2012-03-11 DIAGNOSIS — D649 Anemia, unspecified: Secondary | ICD-10-CM

## 2012-03-11 DIAGNOSIS — R5381 Other malaise: Secondary | ICD-10-CM

## 2012-03-11 DIAGNOSIS — Z79899 Other long term (current) drug therapy: Secondary | ICD-10-CM

## 2012-03-11 LAB — CBC WITH DIFFERENTIAL/PLATELET
Basophils Absolute: 0 10*3/uL (ref 0.0–0.1)
Basophils Relative: 0 % (ref 0–1)
Eosinophils Absolute: 0.2 10*3/uL (ref 0.0–0.7)
Eosinophils Relative: 3 % (ref 0–5)
HCT: 37.5 % (ref 36.0–46.0)
Hemoglobin: 11.9 g/dL — ABNORMAL LOW (ref 12.0–15.0)
Lymphocytes Relative: 31 % (ref 12–46)
Lymphs Abs: 1.8 10*3/uL (ref 0.7–4.0)
MCH: 26.9 pg (ref 26.0–34.0)
MCHC: 31.7 g/dL (ref 30.0–36.0)
MCV: 84.8 fL (ref 78.0–100.0)
Monocytes Absolute: 0.4 10*3/uL (ref 0.1–1.0)
Monocytes Relative: 7 % (ref 3–12)
Neutro Abs: 3.5 10*3/uL (ref 1.7–7.7)
Neutrophils Relative %: 59 % (ref 43–77)
Platelets: 290 10*3/uL (ref 150–400)
RBC: 4.42 MIL/uL (ref 3.87–5.11)
RDW: 14.9 % (ref 11.5–15.5)
WBC: 6 10*3/uL (ref 4.0–10.5)

## 2012-03-11 LAB — LIPID PANEL
Cholesterol: 216 mg/dL — ABNORMAL HIGH (ref 0–200)
HDL: 82 mg/dL (ref >39)
LDL Cholesterol: 114 mg/dL — ABNORMAL HIGH (ref 0–99)
Total CHOL/HDL Ratio: 2.6 Ratio
Triglycerides: 99 mg/dL (ref <150)
VLDL: 20 mg/dL (ref 0–40)

## 2012-03-11 MED ORDER — VENLAFAXINE HCL ER 150 MG PO CP24: 150.0000 mg | ORAL_CAPSULE | Freq: Two times a day (BID) | ORAL | Status: DC

## 2012-03-11 MED ORDER — MODAFINIL 100 MG PO TABS: 100.0000 mg | ORAL_TABLET | Freq: Every day | ORAL | Status: DC

## 2012-03-11 NOTE — Telephone Encounter (Signed)
Called provigil 100mg  into [id number] with 5 refills

## 2012-03-11 NOTE — Patient Instructions (Addendum)
Take the Modifinil daily and let me know it does work and how long.

## 2012-03-11 NOTE — Progress Notes (Signed)
  Subjective:    Patient ID: [name], female    DOB: [birth year], 46 y.o.   MRN: [id number]  HPI She is here for consult. She continues to have difficulty with fatigue, myalgias and some shortness of breath. She has not started taking the Provigil. There was apparently a mixup with communication and she did not know that she was supposed to start this for her fatigue. She does have a history of responding to phentermine in the past for her fatigue. She continues on her Effexor but thinks it has not helped her much. She continues on the medications listed in the chart. She also has a history of anemia. She did have blood work done by her gynecologist.  Review of Systems     Objective:   Physical Exam Alert and in no distress. Blood work was reviewed from her gynecologist.       Assessment & Plan:   1. Anemia  CBC with Differential  2. Fibromyalgia muscle pain  modafinil (PROVIGIL) 100 MG tablet, venlafaxine XR (EFFEXOR-XR) 150 MG 24 hr capsule  3. Fatigue  modafinil (PROVIGIL) 100 MG tablet, venlafaxine XR (EFFEXOR-XR) 150 MG 24 hr capsule  4. Encounter for long-term (current) use of other medications  Lipid panel   I will check a CBC and also check lipids. She is to start taking the Provigil. I will also increase her Effexor to a total of 300 mg per day to see if this will help.

## 2012-03-12 NOTE — Progress Notes (Signed)
Quick Note:  The blood work is normal ______ 

## 2012-03-14 ENCOUNTER — Telehealth: Payer: Self-pay | Admitting: Family Medicine

## 2012-03-18 ENCOUNTER — Telehealth: Payer: Self-pay | Admitting: Family Medicine

## 2012-03-25 ENCOUNTER — Telehealth: Payer: Self-pay | Admitting: Family Medicine

## 2012-03-25 NOTE — Telephone Encounter (Signed)
LM

## 2012-03-27 NOTE — Telephone Encounter (Signed)
LM

## 2012-04-01 ENCOUNTER — Encounter: Payer: Self-pay | Admitting: Family Medicine

## 2012-04-01 ENCOUNTER — Ambulatory Visit (INDEPENDENT_AMBULATORY_CARE_PROVIDER_SITE_OTHER): Payer: BC Managed Care – PPO | Admitting: Family Medicine

## 2012-04-01 VITALS — BP 120/70 | HR 100 | Wt 170.0 lb

## 2012-04-01 DIAGNOSIS — F909 Attention-deficit hyperactivity disorder, unspecified type: Secondary | ICD-10-CM

## 2012-04-01 MED ORDER — AMPHETAMINE-DEXTROAMPHETAMINE 20 MG PO TABS: 20.0000 mg | ORAL_TABLET | Freq: Two times a day (BID) | ORAL | Status: DC

## 2012-04-01 NOTE — Progress Notes (Signed)
  Subjective:    Patient ID: [name], female    DOB: [birth year], 46 y.o.   MRN: [id number]  HPI She comes in today to discuss starting Adderall to help with her fatigue from her fibromyalgia. Further discussion with her indicate she indeed did try Adderall when someone gave her a sample. She noted that this helped especially with focus and attention. Further discussion with her concerning this indicates that even as a child she was noted to be very fidgety and her seat as well as talking excessively. She would wake the last minute to get any work done and usually then do fairly well on this. She did get a lot of school while in high school but apparently did well on testing and also did well in college usually when she would wake her last menstrual, shaving.   Review of Systems     Objective:   Physical Exam Alert and in no distress. ADHD testing score was 47.       Assessment & Plan:   1. ADHD (attention deficit hyperactivity disorder)  amphetamine-dextroamphetamine (ADDERALL, 20MG ,) 20 MG tablet   I discussed this with her in detail. Since her symptoms really did start at an early age and she scored 56 she certainly does have evidence of ADHD. I will place her on Adderall 10 mg twice a day which apparently did work from her previous experience. She will let me know if she indeed does need this twice per day. This may also help with her fatigue associated with fibromyalgia

## 2012-04-01 NOTE — Patient Instructions (Signed)
Me know how on the Adderall works and if you have any trouble, and off of it. You might not need to use it twice a day.

## 2012-04-11 ENCOUNTER — Other Ambulatory Visit: Payer: Self-pay | Admitting: Family Medicine

## 2012-04-11 NOTE — Telephone Encounter (Signed)
Is this ok?

## 2012-04-11 NOTE — Telephone Encounter (Signed)
Renew it

## 2012-04-11 NOTE — Telephone Encounter (Signed)
Med called in per jcl 

## 2012-05-02 ENCOUNTER — Telehealth: Payer: Self-pay | Admitting: Family Medicine

## 2012-05-02 DIAGNOSIS — F909 Attention-deficit hyperactivity disorder, unspecified type: Secondary | ICD-10-CM

## 2012-05-02 MED ORDER — AMPHETAMINE-DEXTROAMPHETAMINE 20 MG PO TABS: 20.0000 mg | ORAL_TABLET | Freq: Two times a day (BID) | ORAL | Status: DC

## 2012-05-02 NOTE — Telephone Encounter (Signed)
Adderall renewed.

## 2012-05-09 ENCOUNTER — Telehealth: Payer: Self-pay | Admitting: Family Medicine

## 2012-05-13 NOTE — Telephone Encounter (Signed)
LM

## 2012-06-16 ENCOUNTER — Other Ambulatory Visit: Payer: Self-pay | Admitting: Family Medicine

## 2012-07-22 ENCOUNTER — Other Ambulatory Visit: Payer: Self-pay | Admitting: Family Medicine

## 2012-07-22 NOTE — Telephone Encounter (Signed)
IS THIS OK 

## 2012-08-01 ENCOUNTER — Ambulatory Visit (INDEPENDENT_AMBULATORY_CARE_PROVIDER_SITE_OTHER): Payer: BC Managed Care – PPO | Admitting: Family Medicine

## 2012-08-01 ENCOUNTER — Encounter: Payer: Self-pay | Admitting: Family Medicine

## 2012-08-01 VITALS — BP 126/90 | HR 100 | Wt 168.0 lb

## 2012-08-01 DIAGNOSIS — F909 Attention-deficit hyperactivity disorder, unspecified type: Secondary | ICD-10-CM

## 2012-08-01 DIAGNOSIS — N926 Irregular menstruation, unspecified: Secondary | ICD-10-CM

## 2012-08-01 DIAGNOSIS — M797 Fibromyalgia: Secondary | ICD-10-CM

## 2012-08-01 DIAGNOSIS — IMO0001 Reserved for inherently not codable concepts without codable children: Secondary | ICD-10-CM

## 2012-08-01 MED ORDER — AMPHETAMINE-DEXTROAMPHETAMINE 20 MG PO TABS: 20.0000 mg | ORAL_TABLET | Freq: Two times a day (BID) | ORAL | Status: DC

## 2012-08-01 NOTE — Patient Instructions (Signed)
Call Dr. Andee Poles [phone]. Let me know when you have an appointment then I will send her some data on you. Also get involved with the EAP.

## 2012-08-01 NOTE — Progress Notes (Signed)
  Subjective:    Patient ID: [name], female    DOB: 1966-07-16, 46 y.o.   MRN: [id number]  HPI She has concerns over irregular menses. She has had this for well over a year. She can sometimes have a period every 10 days to 2 weeks. She complains of continued difficulty with hot flashes She does plan on following up with her gynecologist before the end of the month. For the last 7 years she has noted emotional swings, moodiness, irritability. Mainly this revolves around work. She says that anything can potentially set her off. She recently got in trouble at work. She also states that the ADD medication is not working as well as she would like. She also has underlying fibromyalgia and has been on Effexor for a long period of time.    Review of Systems     Objective:   Physical Exam Alert and in no distress and occasionally tearful.       Assessment & Plan:   1. ADHD (attention deficit hyperactivity disorder)  amphetamine-dextroamphetamine (ADDERALL) 20 MG tablet  2. Fibromyalgia muscle pain    3. Irregular menses     some of her symptoms are suggestive of a mood disorder. This on top of the ADHD and the fibromyalgia makes it difficult to fully assess. I recommended that she followup with Dr. Andee Poles. I will send information to Dr. Nolen Mu when the appointment is made. 30 minutes spent discussing all this information with her

## 2012-08-12 ENCOUNTER — Other Ambulatory Visit: Payer: Self-pay | Admitting: Family Medicine

## 2012-08-12 NOTE — Telephone Encounter (Signed)
Renew this with 5 refills 

## 2012-08-12 NOTE — Telephone Encounter (Signed)
Is this ok?

## 2012-08-12 NOTE — Telephone Encounter (Signed)
Called ambien in per jcl 

## 2012-09-13 ENCOUNTER — Other Ambulatory Visit: Payer: Self-pay | Admitting: Family Medicine

## 2012-09-15 NOTE — Telephone Encounter (Signed)
Is this ok?

## 2012-09-15 NOTE — Telephone Encounter (Signed)
Go ahead and renew these 

## 2012-10-01 ENCOUNTER — Other Ambulatory Visit: Payer: Self-pay | Admitting: Obstetrics and Gynecology

## 2012-10-01 DIAGNOSIS — Z1231 Encounter for screening mammogram for malignant neoplasm of breast: Secondary | ICD-10-CM

## 2012-10-02 ENCOUNTER — Telehealth: Payer: Self-pay | Admitting: Internal Medicine

## 2012-10-02 DIAGNOSIS — F909 Attention-deficit hyperactivity disorder, unspecified type: Secondary | ICD-10-CM

## 2012-10-02 MED ORDER — AMPHETAMINE-DEXTROAMPHETAMINE 20 MG PO TABS: 20.0000 mg | ORAL_TABLET | Freq: Two times a day (BID) | ORAL | Status: DC

## 2012-10-02 NOTE — Telephone Encounter (Signed)
Adderall renewed for 3 months 

## 2012-11-04 ENCOUNTER — Ambulatory Visit
Admission: RE | Admit: 2012-11-04 | Discharge: 2012-11-04 | Disposition: A | Discharge status: Home / Self Care | Payer: Self-pay | Source: Ambulatory Visit | Attending: Obstetrics and Gynecology | Admitting: Obstetrics and Gynecology

## 2012-11-04 DIAGNOSIS — Z1231 Encounter for screening mammogram for malignant neoplasm of breast: Secondary | ICD-10-CM

## 2012-11-08 ENCOUNTER — Ambulatory Visit (INDEPENDENT_AMBULATORY_CARE_PROVIDER_SITE_OTHER): Payer: BC Managed Care – PPO | Admitting: Internal Medicine

## 2012-11-08 VITALS — BP 111/75 | HR 75 | Temp 98.3°F | Resp 16 | Ht 63.0 in | Wt 165.0 lb

## 2012-11-08 DIAGNOSIS — R32 Unspecified urinary incontinence: Secondary | ICD-10-CM

## 2012-11-08 DIAGNOSIS — R82998 Other abnormal findings in urine: Secondary | ICD-10-CM

## 2012-11-08 DIAGNOSIS — R8281 Pyuria: Secondary | ICD-10-CM

## 2012-11-08 LAB — POCT URINALYSIS DIPSTICK
Bilirubin, UA: NEGATIVE
Blood, UA: NEGATIVE
Glucose, UA: NEGATIVE
Ketones, UA: NEGATIVE
Nitrite, UA: NEGATIVE
Spec Grav, UA: 1.025
Urobilinogen, UA: 0.2
pH, UA: 6

## 2012-11-08 LAB — POCT UA - MICROSCOPIC ONLY
Bacteria, U Microscopic: NEGATIVE
Casts, Ur, LPF, POC: NEGATIVE
Crystals, Ur, HPF, POC: NEGATIVE
Yeast, UA: POSITIVE

## 2012-11-08 MED ORDER — NITROFURANTOIN MONOHYD MACRO 100 MG PO CAPS: 100.0000 mg | ORAL_CAPSULE | Freq: Two times a day (BID) | ORAL | Status: DC

## 2012-11-08 NOTE — Progress Notes (Signed)
  Subjective:    Patient ID: [name], female    DOB: Aug 06, 1966, 46 y.o.   MRN: [id number]  HPIu freq/urgency/cuts off/burning with pain when cuts off One episode incontinence Lmp=12-3-short and light//BTL 16 yr ago No vag d/c No flank pain/no belly pain   Review of Systems No fever chills or night sweats No new partners No dyspareunia    Objective:   Physical Exam Vital signs stable No CVA tenderness to percussion Abdomen nontender       Results for orders placed in visit on 11/08/12  POCT URINALYSIS DIPSTICK      Component Value Range   Color, UA yellow     Clarity, UA cloudy     Glucose, UA neg     Bilirubin, UA neg     Ketones, UA neg     Spec Grav, UA 1.025     Blood, UA neg     pH, UA 6.0     Protein, UA trace     Urobilinogen, UA 0.2     Nitrite, UA neg     Leukocytes, UA small (1+)    POCT UA - MICROSCOPIC ONLY      Component Value Range   WBC, Ur, HPF, POC 8-12     RBC, urine, microscopic 1-2     Bacteria, U Microscopic neg     Mucus, UA large     Epithelial cells, urine per micros 4-7     Crystals, Ur, HPF, POC neg     Casts, Ur, LPF, POC neg     Yeast, UA positive      Assessment & Plan:  Problem #1 incontinent episode/urgency Problem #2 pyuria  Urine culture Pyridium now and in the morning Macrobid twice a day #14

## 2012-11-09 ENCOUNTER — Encounter: Payer: Self-pay | Admitting: Internal Medicine

## 2012-11-10 LAB — URINE CULTURE: Colony Count: 2000

## 2012-11-26 HISTORY — PX: TOTAL ABDOMINAL HYSTERECTOMY: SHX209

## 2012-12-04 ENCOUNTER — Encounter: Payer: Self-pay | Admitting: Family Medicine

## 2012-12-04 ENCOUNTER — Ambulatory Visit (INDEPENDENT_AMBULATORY_CARE_PROVIDER_SITE_OTHER): Payer: BC Managed Care – PPO | Admitting: Family Medicine

## 2012-12-04 VITALS — BP 120/82 | HR 60 | Temp 98.0°F | Ht 61.5 in | Wt 166.0 lb

## 2012-12-04 DIAGNOSIS — J329 Chronic sinusitis, unspecified: Secondary | ICD-10-CM

## 2012-12-04 MED ORDER — AMOXICILLIN 875 MG PO TABS: 875.0000 mg | ORAL_TABLET | Freq: Two times a day (BID) | ORAL | Status: DC

## 2012-12-04 NOTE — Progress Notes (Signed)
Chief Complaint  Patient presents with  . Cough    and nasal congestion. She is unsure if she has run any fevers but she does sweat a lot. Symptoms have been going on x 3 weeks.   HPI: Started before Christmas with a scratchy throat and cold symptoms.  Never has improved, has had ongoing symptoms.  Yesterday ears started hurting, still hurting today.  Head feels "swollen"--? If from constant blowing and coughing so much.  Denies sinus headaches.  Having PND, but denies sore throat.  Loses her voice by the end of the day.  Coughs a lot when she is hot, like when she is at work.  Cough is productive of brown phlegm, mixed with yellow, and this has been persistent.  Coughs more at night. She has been using Zicam and a nasal decongestant spray (infrequently because it burns her nose).  Past Medical History  Diagnosis Date  . Fibromyalgia   . Sleep disturbances   . History of migraine headaches   . Depression   . Anxiety   . Fibromyalgia    History   Social History  . Marital Status: Divorced    Spouse Name: N/A    Number of Children: N/A  . Years of Education: N/A   Occupational History  . Not on file.   Social History Main Topics  . Smoking status: Never Smoker   . Smokeless tobacco: Never Used  . Alcohol Use: Yes     Comment: 4 drinks per weekend.  . Drug Use: No  . Sexually Active: Not on file   Other Topics Concern  . Not on file   Social History Narrative  . No narrative on file   Current outpatient prescriptions:amphetamine-dextroamphetamine (ADDERALL) 20 MG tablet, Take 1 tablet (20 mg total) by mouth 2 (two) times daily., Disp: 60 tablet, Rfl: 0;  amphetamine-dextroamphetamine (ADDERALL) 20 MG tablet, Take 1 tablet (20 mg total) by mouth 2 (two) times daily., Disp: 60 tablet, Rfl: 0;  amphetamine-dextroamphetamine (ADDERALL) 20 MG tablet, Take 1 tablet (20 mg total) by mouth 2 (two) times daily., Disp: 60 tablet, Rfl: 0 cyclobenzaprine (FLEXERIL) 10 MG tablet, TAKE ONE  TABLET BY MOUTH EVERY DAY, Disp: 30 tablet, Rfl: 4;  meloxicam (MOBIC) 7.5 MG tablet, TAKE ONE TABLET BY MOUTH EVERY DAY, Disp: 30 tablet, Rfl: 5;  sertraline (ZOLOFT) 100 MG tablet, Take 100 mg by mouth daily., Disp: , Rfl: ;  traMADol (ULTRAM) 50 MG tablet, TAKE ONE TABLET BY MOUTH EVERY 6 HOURS AS NEEDED FOR PAIN, Disp: 60 tablet, Rfl: 5 zolpidem (AMBIEN) 10 MG tablet, TAKE ONE TABLET BY MOUTH AT BEDTIME AS NEEDED, Disp: 30 tablet, Rfl: 5;  amoxicillin (AMOXIL) 875 MG tablet, Take 1 tablet (875 mg total) by mouth 2 (two) times daily., Disp: 20 tablet, Rfl: 0;  valACYclovir (VALTREX) 500 MG tablet, TAKE ONE CAPLET BY MOUTH TWICE DAILY FOR 3 DAYS, Disp: 12 tablet, Rfl: 5  Allergies  Allergen Reactions  . Codeine   . Sulfur    ROS:  Denies shortness of breath, dizziness, nausea, vomiting, diarrhea, abdominal pain, skin rash.  +fibromyalgia  PHYSICAL EXAM: BP 120/82  Pulse 60  Temp 98 F (36.7 C) (Oral)  Ht 5' 1.5" (1.562 m)  Wt 166 lb (75.297 kg)  BMI 30.86 kg/m2  LMP 11/20/2012 Well developed, pleasant female, well-appearing, with occasional dry cough HEENT:  PERRL, EOMI, conjunctiva clear.  TM's and EAC's normal.  Nasal mucosa mildly edematous, slightly red on the left, raw with recent bleeding.  Sinuses nontender.  OP shows thick whitish-yellow phlegm at back of throat, otherwise normal.   Neck: no lymphadenopathy Heart: regular rate and rhythm without murmur Lungs: clear bilaterally with good air movement.  No wheezes, rales, ronchi Skin: no rash Psych: normal mood, affect  ASSESSMENT/PLAN:  1. Sinusitis  amoxicillin (AMOXIL) 875 MG tablet   Recommended use of guaifenesin (ie Mucinex), dextromethorphan as a cough suppressant as needed (DM forms of guaifenesin, vs Delsym syrup), and decongestants as needed (ie sudafed).    Mentioned FMLA forms at end of visit--for her fibromyalgia that she is followed by Dr. Susann Givens for, not related to today's visit, so not filled out by me.

## 2012-12-04 NOTE — Patient Instructions (Addendum)
Try guaifenesin (ie Mucinex), dextromethorphan as a cough suppressant as needed (DM forms of guaifenesin, vs Delsym syrup), and decongestants as needed (ie sudafed).  Take the antibiotics as directed.  Call or return if symptoms progress or worsen.

## 2012-12-11 ENCOUNTER — Other Ambulatory Visit: Payer: Self-pay | Admitting: Family Medicine

## 2012-12-11 NOTE — Telephone Encounter (Signed)
Tramadol renewed.

## 2012-12-11 NOTE — Telephone Encounter (Signed)
Is this ok?

## 2013-01-08 ENCOUNTER — Inpatient Hospital Stay (HOSPITAL_COMMUNITY): Admission: RE | Admit: 2013-01-08 | Payer: BC Managed Care – PPO | Source: Ambulatory Visit

## 2013-01-12 ENCOUNTER — Other Ambulatory Visit: Payer: Self-pay | Admitting: Obstetrics and Gynecology

## 2013-01-12 ENCOUNTER — Encounter (HOSPITAL_COMMUNITY): Payer: Self-pay

## 2013-01-12 ENCOUNTER — Encounter (HOSPITAL_COMMUNITY)
Admission: RE | Admit: 2013-01-12 | Discharge: 2013-01-12 | Disposition: A | Discharge status: Home / Self Care | Payer: BC Managed Care – PPO | Source: Ambulatory Visit | Attending: Obstetrics and Gynecology | Admitting: Obstetrics and Gynecology

## 2013-01-12 HISTORY — DX: Headache: R51

## 2013-01-12 HISTORY — DX: Other complications of anesthesia, initial encounter: T88.59XA

## 2013-01-12 HISTORY — DX: Adverse effect of unspecified anesthetic, initial encounter: T41.45XA

## 2013-01-12 LAB — CBC
HCT: 36.3 % (ref 36.0–46.0)
Hemoglobin: 11.6 g/dL — ABNORMAL LOW (ref 12.0–15.0)
MCH: 26.7 pg (ref 26.0–34.0)
MCHC: 32 g/dL (ref 30.0–36.0)
MCV: 83.6 fL (ref 78.0–100.0)
Platelets: 226 10*3/uL (ref 150–400)
RBC: 4.34 MIL/uL (ref 3.87–5.11)
RDW: 14.9 % (ref 11.5–15.5)
WBC: 4.5 10*3/uL (ref 4.0–10.5)

## 2013-01-12 LAB — SURGICAL PCR SCREEN
MRSA, PCR: NEGATIVE
Staphylococcus aureus: NEGATIVE

## 2013-01-12 NOTE — Patient Instructions (Addendum)
   Your procedure is scheduled WC:BJSEGB February 21st  Enter through the Main Entrance of [facility] at:11:30am Pick up the phone at the desk and dial [phone] and inform us of your arrival.  Please call this number if you have any problems the morning of surgery: [phone]  Remember: Do not eat food after midnight on Thursday night You may have clear liquids until Friday at 9am then nothing You may take your morning medications as instructed per Dr Cherly Hensen with sips of water  Do not wear jewelry, make-up, or FINGER nail polish No metal in your hair or on your body. Do not wear lotions, powders, perfumes. You may wear deodorant.  Please use your CHG wash as directed prior to surgery.  Do not shave anywhere for at least 12 hours prior to first CHG shower.  Do not bring valuables to the hospital.   Leave suitcase in the car. After Surgery it may be brought to your room. For patients being admitted to the hospital, checkout time is 11:00am the day of discharge.

## 2013-01-16 ENCOUNTER — Ambulatory Visit (HOSPITAL_COMMUNITY)
Admission: RE | Admit: 2013-01-16 | Discharge: 2013-01-17 | Disposition: A | Discharge status: Home / Self Care | Payer: BC Managed Care – PPO | Source: Ambulatory Visit | Attending: Obstetrics and Gynecology | Admitting: Obstetrics and Gynecology

## 2013-01-16 ENCOUNTER — Encounter (HOSPITAL_COMMUNITY): Payer: Self-pay | Admitting: Anesthesiology

## 2013-01-16 ENCOUNTER — Ambulatory Visit (HOSPITAL_COMMUNITY): Payer: BC Managed Care – PPO | Admitting: Anesthesiology

## 2013-01-16 ENCOUNTER — Encounter (HOSPITAL_COMMUNITY)
Admission: RE | Disposition: A | Discharge status: Home / Self Care | Payer: Self-pay | Source: Ambulatory Visit | Attending: Obstetrics and Gynecology

## 2013-01-16 DIAGNOSIS — D251 Intramural leiomyoma of uterus: Secondary | ICD-10-CM | POA: Insufficient documentation

## 2013-01-16 DIAGNOSIS — D649 Anemia, unspecified: Secondary | ICD-10-CM | POA: Insufficient documentation

## 2013-01-16 DIAGNOSIS — N920 Excessive and frequent menstruation with regular cycle: Principal | ICD-10-CM | POA: Insufficient documentation

## 2013-01-16 DIAGNOSIS — N831 Corpus luteum cyst of ovary, unspecified side: Secondary | ICD-10-CM | POA: Insufficient documentation

## 2013-01-16 DIAGNOSIS — N838 Other noninflammatory disorders of ovary, fallopian tube and broad ligament: Secondary | ICD-10-CM | POA: Insufficient documentation

## 2013-01-16 DIAGNOSIS — Z90710 Acquired absence of both cervix and uterus: Secondary | ICD-10-CM | POA: Diagnosis not present

## 2013-01-16 HISTORY — PX: SALPINGOOPHORECTOMY: SHX82

## 2013-01-16 HISTORY — PX: LAPAROSCOPIC ASSISTED VAGINAL HYSTERECTOMY: SHX5398

## 2013-01-16 LAB — BASIC METABOLIC PANEL
BUN: 8 mg/dL (ref 6–23)
CO2: 25 mEq/L (ref 19–32)
Calcium: 9 mg/dL (ref 8.4–10.5)
Chloride: 103 mEq/L (ref 96–112)
Creatinine, Ser: 0.64 mg/dL (ref 0.50–1.10)
GFR calc Af Amer: >90 mL/min (ref >90)
GFR calc non Af Amer: >90 mL/min (ref >90)
Glucose, Bld: 89 mg/dL (ref 70–99)
Potassium: 3.9 mEq/L (ref 3.5–5.1)
Sodium: 136 mEq/L (ref 135–145)

## 2013-01-16 SURGERY — HYSTERECTOMY, VAGINAL, LAPAROSCOPY-ASSISTED
Anesthesia: General | Site: Abdomen | Wound class: Clean Contaminated

## 2013-01-16 MED ORDER — ACETAMINOPHEN 10 MG/ML IV SOLN
INTRAVENOUS | Status: AC
  Administered 2013-01-16: 1000 mg via INTRAVENOUS
  Filled 2013-01-16: qty 100

## 2013-01-16 MED ORDER — MIDAZOLAM HCL 5 MG/5ML IJ SOLN
INTRAMUSCULAR | Status: DC | PRN
  Administered 2013-01-16: 2 mg via INTRAVENOUS

## 2013-01-16 MED ORDER — DEXAMETHASONE SODIUM PHOSPHATE 4 MG/ML IJ SOLN
INTRAMUSCULAR | Status: DC | PRN
  Administered 2013-01-16: 10 mg via INTRAVENOUS

## 2013-01-16 MED ORDER — BUPIVACAINE HCL (PF) 0.25 % IJ SOLN
INTRAMUSCULAR | Status: DC | PRN
  Administered 2013-01-16: 8 mL

## 2013-01-16 MED ORDER — PROPOFOL 10 MG/ML IV EMUL
INTRAVENOUS | Status: AC
  Filled 2013-01-16: qty 20

## 2013-01-16 MED ORDER — ONDANSETRON HCL 4 MG PO TABS: 4.0000 mg | ORAL_TABLET | Freq: Four times a day (QID) | ORAL | Status: DC | PRN

## 2013-01-16 MED ORDER — LIDOCAINE-EPINEPHRINE (PF) 1 %-1:200000 IJ SOLN
INTRAMUSCULAR | Status: DC | PRN
  Administered 2013-01-16: 19 mL

## 2013-01-16 MED ORDER — ROCURONIUM BROMIDE 50 MG/5ML IV SOLN
INTRAVENOUS | Status: AC
  Filled 2013-01-16: qty 1

## 2013-01-16 MED ORDER — ONDANSETRON HCL 4 MG/2ML IJ SOLN
INTRAMUSCULAR | Status: DC | PRN
  Administered 2013-01-16: 4 mg via INTRAVENOUS

## 2013-01-16 MED ORDER — AMPHETAMINE-DEXTROAMPHETAMINE 20 MG PO TABS: 20.0000 mg | ORAL_TABLET | Freq: Two times a day (BID) | ORAL | Status: DC

## 2013-01-16 MED ORDER — CEFAZOLIN SODIUM-DEXTROSE 2-3 GM-% IV SOLR
2.0000 g | INTRAVENOUS | Status: AC
  Administered 2013-01-16: 2 g via INTRAVENOUS

## 2013-01-16 MED ORDER — FENTANYL CITRATE 0.05 MG/ML IJ SOLN
INTRAMUSCULAR | Status: DC | PRN
  Administered 2013-01-16 (×2): 50 ug via INTRAVENOUS
  Administered 2013-01-16: 100 ug via INTRAVENOUS
  Administered 2013-01-16: 50 ug via INTRAVENOUS

## 2013-01-16 MED ORDER — ACETAMINOPHEN 10 MG/ML IV SOLN: 1000.0000 mg | Freq: Four times a day (QID) | INTRAVENOUS | Status: DC

## 2013-01-16 MED ORDER — LIDOCAINE HCL (CARDIAC) 20 MG/ML IV SOLN
INTRAVENOUS | Status: DC | PRN
  Administered 2013-01-16: 80 mg via INTRAVENOUS

## 2013-01-16 MED ORDER — KETOROLAC TROMETHAMINE 60 MG/2ML IM SOLN
INTRAMUSCULAR | Status: DC | PRN
  Administered 2013-01-16: 30 mg via INTRAMUSCULAR

## 2013-01-16 MED ORDER — DEXTROSE IN LACTATED RINGERS 5 % IV SOLN: INTRAVENOUS | Status: DC

## 2013-01-16 MED ORDER — DEXAMETHASONE SODIUM PHOSPHATE 10 MG/ML IJ SOLN
INTRAMUSCULAR | Status: AC
  Filled 2013-01-16: qty 1

## 2013-01-16 MED ORDER — MIDAZOLAM HCL 2 MG/2ML IJ SOLN
INTRAMUSCULAR | Status: AC
  Filled 2013-01-16: qty 2

## 2013-01-16 MED ORDER — MENTHOL 3 MG MT LOZG
1.0000 | LOZENGE | OROMUCOSAL | Status: DC | PRN
  Administered 2013-01-17: 3 mg via ORAL
  Filled 2013-01-16: qty 9

## 2013-01-16 MED ORDER — NEOSTIGMINE METHYLSULFATE 1 MG/ML IJ SOLN
INTRAMUSCULAR | Status: DC | PRN
  Administered 2013-01-16: 4 mg via INTRAVENOUS

## 2013-01-16 MED ORDER — CYCLOBENZAPRINE HCL 10 MG PO TABS
10.0000 mg | ORAL_TABLET | Freq: Three times a day (TID) | ORAL | Status: DC
  Administered 2013-01-16 – 2013-01-17 (×2): 10 mg via ORAL
  Filled 2013-01-16 (×2): qty 1

## 2013-01-16 MED ORDER — DIPHENHYDRAMINE HCL 25 MG PO CAPS
25.0000 mg | ORAL_CAPSULE | Freq: Four times a day (QID) | ORAL | Status: DC | PRN
  Administered 2013-01-16: 25 mg via ORAL
  Filled 2013-01-16: qty 1

## 2013-01-16 MED ORDER — GLYCOPYRROLATE 0.2 MG/ML IJ SOLN
INTRAMUSCULAR | Status: AC
  Filled 2013-01-16: qty 1

## 2013-01-16 MED ORDER — LIDOCAINE-EPINEPHRINE (PF) 1 %-1:200000 IJ SOLN
INTRAMUSCULAR | Status: AC
  Filled 2013-01-16: qty 10

## 2013-01-16 MED ORDER — PANTOPRAZOLE SODIUM 40 MG PO TBEC
40.0000 mg | DELAYED_RELEASE_TABLET | Freq: Every day | ORAL | Status: DC
  Administered 2013-01-16 – 2013-01-17 (×2): 40 mg via ORAL
  Filled 2013-01-16 (×2): qty 1

## 2013-01-16 MED ORDER — GLYCOPYRROLATE 0.2 MG/ML IJ SOLN
INTRAMUSCULAR | Status: DC | PRN
  Administered 2013-01-16: 0.6 mg via INTRAVENOUS

## 2013-01-16 MED ORDER — PHENYLEPHRINE HCL 10 MG/ML IJ SOLN
INTRAMUSCULAR | Status: DC | PRN
  Administered 2013-01-16 (×4): 80 ug via INTRAVENOUS
  Administered 2013-01-16: 40 ug via INTRAVENOUS

## 2013-01-16 MED ORDER — CEFAZOLIN SODIUM 1-5 GM-% IV SOLN
1.0000 g | Freq: Three times a day (TID) | INTRAVENOUS | Status: AC
  Administered 2013-01-16 – 2013-01-17 (×2): 1 g via INTRAVENOUS
  Filled 2013-01-16 (×2): qty 50

## 2013-01-16 MED ORDER — HYDROMORPHONE HCL 2 MG PO TABS: 4.0000 mg | ORAL_TABLET | ORAL | Status: DC | PRN

## 2013-01-16 MED ORDER — PROPOFOL 10 MG/ML IV EMUL
INTRAVENOUS | Status: DC | PRN
  Administered 2013-01-16: 150 mg via INTRAVENOUS

## 2013-01-16 MED ORDER — DIPHENHYDRAMINE HCL 50 MG/ML IJ SOLN: 25.0000 mg | Freq: Four times a day (QID) | INTRAMUSCULAR | Status: DC | PRN

## 2013-01-16 MED ORDER — BUPIVACAINE HCL (PF) 0.25 % IJ SOLN
INTRAMUSCULAR | Status: AC
  Filled 2013-01-16: qty 30

## 2013-01-16 MED ORDER — LACTATED RINGERS IV SOLN
INTRAVENOUS | Status: DC
  Administered 2013-01-16 (×4): via INTRAVENOUS

## 2013-01-16 MED ORDER — SODIUM CHLORIDE 0.9 % IJ SOLN
INTRAMUSCULAR | Status: DC | PRN
  Administered 2013-01-16: 8 mL

## 2013-01-16 MED ORDER — ROCURONIUM BROMIDE 100 MG/10ML IV SOLN
INTRAVENOUS | Status: DC | PRN
  Administered 2013-01-16: 10 mg via INTRAVENOUS
  Administered 2013-01-16: 5 mg via INTRAVENOUS
  Administered 2013-01-16: 45 mg via INTRAVENOUS
  Administered 2013-01-16: 10 mg via INTRAVENOUS

## 2013-01-16 MED ORDER — FENTANYL CITRATE 0.05 MG/ML IJ SOLN
INTRAMUSCULAR | Status: AC
  Filled 2013-01-16: qty 5

## 2013-01-16 MED ORDER — ONDANSETRON HCL 4 MG/2ML IJ SOLN: 4.0000 mg | Freq: Four times a day (QID) | INTRAMUSCULAR | Status: DC | PRN

## 2013-01-16 MED ORDER — PHENYLEPHRINE 40 MCG/ML (10ML) SYRINGE FOR IV PUSH (FOR BLOOD PRESSURE SUPPORT)
PREFILLED_SYRINGE | INTRAVENOUS | Status: AC
  Filled 2013-01-16: qty 5

## 2013-01-16 MED ORDER — HYDROMORPHONE HCL PF 1 MG/ML IJ SOLN: 0.2000 mg | INTRAMUSCULAR | Status: DC | PRN

## 2013-01-16 MED ORDER — KETOROLAC TROMETHAMINE 30 MG/ML IJ SOLN
INTRAMUSCULAR | Status: DC | PRN
  Administered 2013-01-16: 30 mg via INTRAVENOUS

## 2013-01-16 MED ORDER — LIDOCAINE HCL (CARDIAC) 20 MG/ML IV SOLN
INTRAVENOUS | Status: AC
  Filled 2013-01-16: qty 5

## 2013-01-16 MED ORDER — SERTRALINE HCL 100 MG PO TABS
100.0000 mg | ORAL_TABLET | Freq: Every day | ORAL | Status: DC
  Administered 2013-01-16 – 2013-01-17 (×2): 100 mg via ORAL
  Filled 2013-01-16 (×2): qty 1

## 2013-01-16 MED ORDER — AMPHETAMINE-DEXTROAMPHETAMINE 20 MG PO TABS
20.0000 mg | ORAL_TABLET | Freq: Two times a day (BID) | ORAL | Status: DC
Start: 2013-01-16 — End: 2013-01-16

## 2013-01-16 MED ORDER — NEOSTIGMINE METHYLSULFATE 1 MG/ML IJ SOLN
INTRAMUSCULAR | Status: AC
  Filled 2013-01-16: qty 1

## 2013-01-16 MED ORDER — KETOROLAC TROMETHAMINE 30 MG/ML IJ SOLN
30.0000 mg | Freq: Four times a day (QID) | INTRAMUSCULAR | Status: DC
Start: 2013-01-16 — End: 2013-01-17

## 2013-01-16 MED ORDER — ESTRADIOL 0.1 MG/24HR TD PTWK
0.1000 mg | MEDICATED_PATCH | TRANSDERMAL | Status: DC
  Administered 2013-01-16: 0.1 mg via TRANSDERMAL
  Filled 2013-01-16: qty 1

## 2013-01-16 MED ORDER — GLYCOPYRROLATE 0.2 MG/ML IJ SOLN
INTRAMUSCULAR | Status: AC
  Filled 2013-01-16: qty 2

## 2013-01-16 MED ORDER — IBUPROFEN 800 MG PO TABS: 800.0000 mg | ORAL_TABLET | Freq: Three times a day (TID) | ORAL | Status: DC | PRN

## 2013-01-16 MED ORDER — LACTATED RINGERS IR SOLN
Status: DC | PRN
  Administered 2013-01-16: 3000 mL

## 2013-01-16 MED ORDER — ZOLPIDEM TARTRATE 5 MG PO TABS
5.0000 mg | ORAL_TABLET | Freq: Every evening | ORAL | Status: DC | PRN
  Administered 2013-01-16: 5 mg via ORAL
  Filled 2013-01-16: qty 1

## 2013-01-16 MED ORDER — HYDROMORPHONE HCL PF 1 MG/ML IJ SOLN
0.2500 mg | INTRAMUSCULAR | Status: DC | PRN
  Administered 2013-01-16 (×3): 0.5 mg via INTRAVENOUS

## 2013-01-16 MED ORDER — KETOROLAC TROMETHAMINE 30 MG/ML IJ SOLN
30.0000 mg | Freq: Four times a day (QID) | INTRAMUSCULAR | Status: DC
  Administered 2013-01-16 – 2013-01-17 (×2): 30 mg via INTRAVENOUS
  Filled 2013-01-16 (×2): qty 1

## 2013-01-16 MED ORDER — ONDANSETRON HCL 4 MG/2ML IJ SOLN
INTRAMUSCULAR | Status: AC
  Filled 2013-01-16: qty 2

## 2013-01-16 MED ORDER — CEFAZOLIN SODIUM-DEXTROSE 2-3 GM-% IV SOLR
INTRAVENOUS | Status: AC
  Filled 2013-01-16: qty 50

## 2013-01-16 MED ORDER — HYDROMORPHONE HCL PF 1 MG/ML IJ SOLN
INTRAMUSCULAR | Status: AC
  Filled 2013-01-16: qty 1

## 2013-01-16 SURGICAL SUPPLY — 47 items
ADH SKN CLS APL DERMABOND .7 (GAUZE/BANDAGES/DRESSINGS) ×2
APPLICATOR COTTON TIP 6IN STRL (MISCELLANEOUS) ×3 IMPLANT
CABLE HIGH FREQUENCY MONO STRZ (ELECTRODE) IMPLANT
CHLORAPREP W/TINT 26ML (MISCELLANEOUS) ×1 IMPLANT
CLOTH BEACON ORANGE TIMEOUT ST (SAFETY) ×3 IMPLANT
CONT PATH 16OZ SNAP LID 3702 (MISCELLANEOUS) ×1 IMPLANT
COVER TABLE BACK 60X90 (DRAPES) ×3 IMPLANT
DECANTER SPIKE VIAL GLASS SM (MISCELLANEOUS) ×2 IMPLANT
DERMABOND ADVANCED (GAUZE/BANDAGES/DRESSINGS) ×1
DERMABOND ADVANCED .7 DNX12 (GAUZE/BANDAGES/DRESSINGS) ×1 IMPLANT
ELECT LIGASURE LONG (ELECTRODE) ×3 IMPLANT
ELECT REM PT RETURN 9FT ADLT (ELECTROSURGICAL) ×3
ELECTRODE REM PT RTRN 9FT ADLT (ELECTROSURGICAL) IMPLANT
FORCEPS CUTTING 33CM 5MM (CUTTING FORCEPS) ×1 IMPLANT
GLOVE BIO SURGEON STRL SZ 6.5 (GLOVE) ×4 IMPLANT
GLOVE BIOGEL PI IND STRL 6.5 (GLOVE) ×2 IMPLANT
GLOVE BIOGEL PI IND STRL 7.0 (GLOVE) ×5 IMPLANT
GLOVE BIOGEL PI INDICATOR 6.5 (GLOVE) ×2
GLOVE BIOGEL PI INDICATOR 7.0 (GLOVE) ×4
GLOVE SURG SS PI 7.0 STRL IVOR (GLOVE) ×4 IMPLANT
GLOVE SURG SS PI 7.5 STRL IVOR (GLOVE) ×2 IMPLANT
GOWN STRL REIN XL XLG (GOWN DISPOSABLE) ×14 IMPLANT
HEMOSTAT SURGICEL 2X14 (HEMOSTASIS) ×1 IMPLANT
NEEDLE INSUFFLATION 120MM (ENDOMECHANICALS) ×3 IMPLANT
NEEDLE INSUFFLATION 150MM (ENDOMECHANICALS) ×1 IMPLANT
NS IRRIG 1000ML POUR BTL (IV SOLUTION) ×3 IMPLANT
PACK LAVH (CUSTOM PROCEDURE TRAY) ×3 IMPLANT
PAD OB MATERNITY 4.3X12.25 (PERSONAL CARE ITEMS) ×1 IMPLANT
PROTECTOR NERVE ULNAR (MISCELLANEOUS) ×4 IMPLANT
SCISSORS LAP 5X35 DISP (ENDOMECHANICALS) ×2 IMPLANT
SET IRRIG TUBING LAPAROSCOPIC (IRRIGATION / IRRIGATOR) ×3 IMPLANT
SOLUTION ELECTROLUBE (MISCELLANEOUS) ×1 IMPLANT
SUT VIC AB 0 CT1 18XCR BRD8 (SUTURE) ×4 IMPLANT
SUT VIC AB 0 CT1 36 (SUTURE) ×9 IMPLANT
SUT VIC AB 0 CT1 8-18 (SUTURE) ×9
SUT VIC AB 0 CT2 27 (SUTURE) ×3 IMPLANT
SUT VIC AB 3-0 SH 27 (SUTURE) ×6
SUT VIC AB 3-0 SH 27X BRD (SUTURE) ×2 IMPLANT
SUT VICRYL 0 TIES 12 18 (SUTURE) ×3 IMPLANT
SUT VICRYL 0 UR6 27IN ABS (SUTURE) ×2 IMPLANT
SUT VICRYL 4-0 PS2 18IN ABS (SUTURE) ×3 IMPLANT
TOWEL OR 17X24 6PK STRL BLUE (TOWEL DISPOSABLE) ×8 IMPLANT
TRAY FOLEY CATH 14FR (SET/KITS/TRAYS/PACK) ×3 IMPLANT
TROCAR OPTI TIP 5M 100M (ENDOMECHANICALS) ×6 IMPLANT
TROCAR XCEL DIL TIP R 11M (ENDOMECHANICALS) ×3 IMPLANT
WARMER LAPAROSCOPE (MISCELLANEOUS) ×3 IMPLANT
WATER STERILE IRR 1000ML POUR (IV SOLUTION) ×3 IMPLANT

## 2013-01-16 NOTE — Transfer of Care (Signed)
Immediate Anesthesia Transfer of Care Note  Patient: [name]  Procedure(s) Performed: Procedure(s): LAPAROSCOPIC ASSISTED VAGINAL HYSTERECTOMY (N/A) SALPINGO OOPHORECTOMY (Bilateral)  Patient Location: PACU  Anesthesia Type:General  Level of Consciousness: awake and sedated  Airway & Oxygen Therapy: Patient Spontanous Breathing and Patient connected to nasal cannula oxygen  Post-op Assessment: Report given to PACU RN and Post -op Vital signs reviewed and stable  Post vital signs: Reviewed and stable  Complications: No apparent anesthesia complications

## 2013-01-16 NOTE — OR Nursing (Signed)
Pt's arms, hands and legs checked for correct positioning and placement at 1400 and 1435.  Pt's arms, hands and legs are properly placed and positioned.

## 2013-01-16 NOTE — Anesthesia Preprocedure Evaluation (Signed)
Anesthesia Evaluation  Patient identified by MRN, date of birth, ID band Patient awake    Reviewed: Allergy & Precautions, H&P , NPO status , Patient's Chart, lab work & pertinent test results, reviewed documented beta blocker date and time   Airway Mallampati: II      Dental  (+) Teeth Intact Full braces:   Pulmonary neg pulmonary ROS,  breath sounds clear to auscultation  Pulmonary exam normal       Cardiovascular negative cardio ROS  Rhythm:regular Rate:Normal     Neuro/Psych  Headaches, PSYCHIATRIC DISORDERS (anxiety, depression, ADHD, chronic fatigue) Low back pain    GI/Hepatic negative GI ROS, Neg liver ROS,   Endo/Other  negative endocrine ROS  Renal/GU   Female GU complaint     Musculoskeletal  (+) Fibromyalgia -  Abdominal   Peds  Hematology negative hematology ROS (+)   Anesthesia Other Findings   Reproductive/Obstetrics negative OB ROS                           Anesthesia Physical Anesthesia Plan  ASA: II  Anesthesia Plan: General ETT   Post-op Pain Management:    Induction:   Airway Management Planned:   Additional Equipment:   Intra-op Plan:   Post-operative Plan:   Informed Consent: I have reviewed the patients History and Physical, chart, labs and discussed the procedure including the risks, benefits and alternatives for the proposed anesthesia with the patient or authorized representative who has indicated his/her understanding and acceptance.   Dental Advisory Given  Plan Discussed with: CRNA and Surgeon  Anesthesia Plan Comments:         Anesthesia Quick Evaluation

## 2013-01-16 NOTE — Preoperative (Signed)
Beta Blockers   Reason not to administer Beta Blockers:Not Applicable 

## 2013-01-16 NOTE — Brief Op Note (Signed)
01/16/2013  4:23 PM  PATIENT:  [name]  47 y.o. female  PRE-OPERATIVE DIAGNOSIS:  Menorrhagia    POST-OPERATIVE DIAGNOSIS:  Menorrhagia    PROCEDURE:  Procedure(s): LAPAROSCOPIC ASSISTED VAGINAL HYSTERECTOMY (N/A) SALPINGO OOPHORECTOMY (Bilateral)  SURGEON:  Surgeon(s) and Role:    * [place] Madding Cathie Beams, MD - Primary  PHYSICIAN ASSISTANT:   ASSISTANTS: Marlinda Mike, CNM   ANESTHESIA:   general Findings: bulky uterus, nl ovaries, tubes w/ cyst of morgagni  192 grams Nl liver edge EBL:  Total I/O In: 2000 [I.V.:2000] Out: 600 [Urine:300; Blood:300]  BLOOD ADMINISTERED:none  DRAINS: none   LOCAL MEDICATIONS USED:  MARCAINE     SPECIMEN:  Source of Specimen:  uterus with cervix, both tubes and ovaries  DISPOSITION OF SPECIMEN:  PATHOLOGY  COUNTS:  YES  TOURNIQUET:  * No tourniquets in log *  DICTATION: .Other Dictation: Dictation Number [id number]  PLAN OF CARE: Admit for overnight observation  PATIENT DISPOSITION:  PACU - hemodynamically stable.   Delay start of Pharmacological VTE agent (>24hrs) due to surgical blood loss or risk of bleeding: no

## 2013-01-16 NOTE — Anesthesia Postprocedure Evaluation (Signed)
  Anesthesia Post-op Note  Anesthesia Post Note  Patient: [name]  Procedure(s) Performed: Procedure(s) (LRB): LAPAROSCOPIC ASSISTED VAGINAL HYSTERECTOMY (N/A) SALPINGO OOPHORECTOMY (Bilateral)  Anesthesia type: General  Patient location: PACU  Post pain: Pain level controlled  Post assessment: Post-op Vital signs reviewed  Last Vitals:  Filed Vitals:   01/16/13 1715  BP:   Pulse: 97  Temp: 37.1 C  Resp: 26    Post vital signs: Reviewed  Level of consciousness: sedated  Complications: No apparent anesthesia complications

## 2013-01-17 LAB — CBC
HCT: 30.8 % — ABNORMAL LOW (ref 36.0–46.0)
Hemoglobin: 10 g/dL — ABNORMAL LOW (ref 12.0–15.0)
MCH: 26.9 pg (ref 26.0–34.0)
MCHC: 32.5 g/dL (ref 30.0–36.0)
MCV: 82.8 fL (ref 78.0–100.0)
Platelets: 228 10*3/uL (ref 150–400)
RBC: 3.72 MIL/uL — ABNORMAL LOW (ref 3.87–5.11)
RDW: 15 % (ref 11.5–15.5)
WBC: 8.1 10*3/uL (ref 4.0–10.5)

## 2013-01-17 LAB — BASIC METABOLIC PANEL
BUN: 6 mg/dL (ref 6–23)
CO2: 26 mEq/L (ref 19–32)
Calcium: 8.9 mg/dL (ref 8.4–10.5)
Chloride: 104 mEq/L (ref 96–112)
Creatinine, Ser: 0.6 mg/dL (ref 0.50–1.10)
GFR calc Af Amer: >90 mL/min (ref >90)
GFR calc non Af Amer: >90 mL/min (ref >90)
Glucose, Bld: 139 mg/dL — ABNORMAL HIGH (ref 70–99)
Potassium: 3.8 mEq/L (ref 3.5–5.1)
Sodium: 136 mEq/L (ref 135–145)

## 2013-01-17 MED ORDER — IBUPROFEN 800 MG PO TABS: 800.0000 mg | ORAL_TABLET | Freq: Three times a day (TID) | ORAL | Status: DC | PRN

## 2013-01-17 MED ORDER — ESTRADIOL 0.1 MG/24HR TD PTWK: 1.0000 | MEDICATED_PATCH | TRANSDERMAL | Status: DC

## 2013-01-17 NOTE — Progress Notes (Signed)
Discharge instructions reviewed with patient.  Patient states understanding of home care , signs/symptoms to report to MD, medications and return MD office visit.  No home equipment needed.  Patient left unit via wheelchair in stable condition with staff without incident and discharged.

## 2013-01-17 NOTE — Discharge Summary (Signed)
Physician Discharge Summary  Patient ID: [name] MRN: [id number] DOB/AGE: [id number] y.o.  Admit date: 01/16/2013 Discharge date: 01/17/2013  Admission Diagnoses:menorrhagia   Discharge Diagnoses: menorhagia Principal Problem:   S/P laparoscopic assisted vaginal hysterectomy (LAVH) BSO  Discharged Condition: stable  Hospital Course: uneventful postop course. S/P LAVHBSO. Path pending. Mild anemic (asx) Consults: None  Significant Diagnostic Studies: labs: hgb 10.0 hct 30.8 wbc 8.1  plt 228K Creat 0.6  Treatments: surgery: LAVH BSO  Discharge Exam: Blood pressure 115/69, pulse 100, temperature 98.4 F (36.9 C), temperature source Oral, resp. rate 20, height 5\' 1"  (1.549 m), weight 73.936 kg (163 lb), SpO2 100.00%. General appearance: alert, cooperative and no distress Resp: clear to auscultation bilaterally Breasts: soft nontender Cardio: S1, S2 normal GI: soft, non-tender; bowel sounds normal; no masses,  no organomegaly and incisions well aproximated no erythema Pelvic: deferred Extremities: no edema, redness or tenderness in the calves or thighs Incision/Wound: (-) erythema./induration or exudate  Disposition: 01-Home or Self Care  Discharge Orders   Future Orders Complete By Expires     Diet general  As directed     Discharge instructions  As directed     Comments:      Call if temperature greater than equal to 100.4, nothing per vagina for 4-6 weeks or severe nausea vomiting, increased incisional pain , drainage or redness in the incision site, no straining with bowel movements, showers no bath    May shower / Bathe  As directed     May walk up steps  As directed     No wound care  As directed         Medication List    TAKE these medications       amphetamine-dextroamphetamine 20 MG tablet  Commonly known as:  ADDERALL  Take 1 tablet (20 mg total) by mouth 2 (two) times daily.     amphetamine-dextroamphetamine 20 MG tablet  Commonly  known as:  ADDERALL  Take 1 tablet (20 mg total) by mouth 2 (two) times daily.     cyclobenzaprine 10 MG tablet  Commonly known as:  FLEXERIL  TAKE ONE TABLET BY MOUTH EVERY DAY     estradiol 0.1 mg/24hr  Commonly known as:  CLIMARA - Dosed in mg/24 hr  Place 1 patch (0.1 mg total) onto the skin once a week.     ibuprofen 800 MG tablet  Commonly known as:  ADVIL,MOTRIN  Take 1 tablet (800 mg total) by mouth every 8 (eight) hours as needed (mild pain).     meloxicam 7.5 MG tablet  Commonly known as:  MOBIC  TAKE ONE TABLET BY MOUTH EVERY DAY     sertraline 100 MG tablet  Commonly known as:  ZOLOFT  Take 100 mg by mouth daily.     traMADol 50 MG tablet  Commonly known as:  ULTRAM  TAKE ONE TABLET BY MOUTH EVERY 6 HOURS AS NEEDED FOR PAIN     valACYclovir 1000 MG tablet  Commonly known as:  VALTREX  Take 1,000 mg by mouth 2 (two) times daily.     zolpidem 10 MG tablet  Commonly known as:  AMBIEN  TAKE ONE TABLET BY MOUTH AT BEDTIME AS NEEDED         Signed: Meredeth Furber A 01/17/2013, 11:16 AM

## 2013-01-17 NOTE — Anesthesia Postprocedure Evaluation (Signed)
  Anesthesia Post-op Note  Patient: [name]  Procedure(s) Performed: Procedure(s): LAPAROSCOPIC ASSISTED VAGINAL HYSTERECTOMY (N/A) SALPINGO OOPHORECTOMY (Bilateral)  Patient Location: [facility] Unit  Anesthesia Type:General  Level of Consciousness: awake, alert  and oriented  Airway and Oxygen Therapy: Patient Spontanous Breathing  Post-op Pain: mild  Post-op Assessment: Post-op Vital signs reviewed and Patient's Cardiovascular Status Stable  Post-op Vital Signs: Reviewed and stable  Complications: No apparent anesthesia complications

## 2013-01-17 NOTE — Progress Notes (Signed)
Subjective: Patient reports tolerating PO, + flatus and no problems voiding.    Objective: I have reviewed patient's vital signs.  vital signs, intake and output and labs. Filed Vitals:   01/17/13 1000  BP: 115/69  Pulse: 100  Temp: 98.4 F (36.9 C)  Resp: 20   I/O last 3 completed shifts: In: 4425.8 [P.O.:1520; I.V.:2805.8; IV Piggyback:100] Out: 2300 [Urine:2000; Blood:300]    Lab Results  Component Value Date   WBC 8.1 01/17/2013   HGB 10.0* 01/17/2013   HCT 30.8* 01/17/2013   MCV 82.8 01/17/2013   PLT 228 01/17/2013   Lab Results  Component Value Date   CREATININE 0.60 01/17/2013    EXAM General: alert, cooperative and no distress Resp: clear to auscultation bilaterally Cardio: regular rate and rhythm, S1, S2 normal, no murmur, click, rub or gallop GI: soft, non-tender; bowel sounds normal; no masses,  no organomegaly and incision: clean, dry and intact Extremities: no edema, redness or tenderness in the calves or thighs Vaginal Bleeding: none  Assessment: s/p Procedure(s): LAPAROSCOPIC ASSISTED VAGINAL HYSTERECTOMY, BIlateral SALPINGO OOPHORECTOMY: stable, progressing well, tolerating diet and anemia  Plan: Advance diet Encourage ambulation Advance to PO medication Discharge home  D/c instructions reviewed. Motrin script. Iron supplement. F/u 6 weeks  LOS: 1 day    Vergil Burby A, MD 01/17/2013 11:06 AM    01/17/2013, 11:06 AM

## 2013-01-17 NOTE — Op Note (Signed)
[name], [name]         ACCOUNT NO.:  [id number]  MEDICAL RECORD NO.:  [id number]  LOCATION:  9319                          FACILITY:  [facility]  PHYSICIAN:  Maxie Better, M.D.DATE OF BIRTH:  Jan 15, 1966  DATE OF PROCEDURE:  01/16/2013 DATE OF DISCHARGE:                              OPERATIVE REPORT   PREOPERATIVE DIAGNOSIS:  Menorrhagia.  POSTOPERATIVE DIAGNOSIS:  Menorrhagia.  PROCEDURES: 1. Laparoscopic-assisted vaginal hysterectomy. 2. Bilateral salpingo-oophorectomy. 3. Rogelio Seen culdoplasty  ANESTHESIA:  General.  SURGEON:  Maxie Better, MD  ASSISTANT:  Marlinda Mike, CNM  DESCRIPTION OF PROCEDURE:  Under adequate general anesthesia, the patient was placed in the dorsal lithotomy position.  She was sterilely prepped and draped in usual fashion.  An indwelling Foley catheter was sterilely placed.  Examination under anesthesia revealed a bulky anteverted uterus.  No adnexal masses could be appreciated.  A bivalve speculum was placed in the vagina.  Single-tooth tenaculum was placed on the anterior lip of the cervix.  An Acorn cannula was introduced into the cervical os and attached to the tenaculum for manipulation of the uterus.  A bivalve speculum was then removed.  Attention was then turned to the abdomen.  A 0.25% Marcaine was injected along the previous infraumbilical skin incision.  The infraumbilical skin incision was made through the previous scar.  Veress needle was introduced, tested on several occasions until placement was confirmed.  A 3.1 L of CO2 was insufflated at opening pressure of 9.  Veress needle was then removed. A 10-mm disposable trocar with sleeve was introduced into the abdomen without incident.  A lighted video laparoscope was placed through that port.  Panoramic inspection noted normal liver edge.  Pelvis with a bulky uterus.  Tubes and ovaries were noted to be normal.  A 0.25% Marcaine was injected in the right and the left lower  quadrants.  Five- mm incisions were made.  A 5-mm port was placed through those sites under direct visualization.  The pelvis was inspected.  No endometriosis noted.  Both tubes and ovaries were normal.  Using the tripolar Cautery, both round ligaments bilaterally serially clamped, cauterized, and then cut.  Ureters were noted to be peristalsing deep in the pelvis on the both sides.  The underlying IP ligament was bilaterally clamped, cauterized, and then cut, and the tubes and ovaries were then serially clamped, cauterized, and cut until the level of the round ligament was reached on both sides.  The uterine vessels were bilaterally inspected.  The vesicouterine peritoneum was opened anteriorly and the bladder was gently dissected off the lower uterine segment.  Both uterine vessels on both sides in that area were both cauterized, but not cut.  At that point, the procedure was then turned towards the vagina.  The instruments and the ports were removed, but the ports remained at the vagina.  The manipulator apparatus was removed.  A weighted speculum was placed in the vagina.  Anterior and posterior lip of the cervix was grasped with Brooke Pace clamps. A dilute solution of Pitressin with 1:200,000 epinephrine was injected circumferentially at the cervicovaginal junction.  A curvilinear incision was then made circumferentially at that level.  The sharp dissection was then performed and ultimately, the posterior cul-de-sac  was opened.  The vaginal cuff at that point was oversewn with 0 Vicryl running lock stitch.  The uterosacral ligaments were then bilaterally clamped, cut, and suture ligated with 0 Vicryl suture.  Anteriorly, the anterior cul-de-sac was entered with sharp dissection.  At that point, the gyrus was then used to serially clamp, cauterize, and then cut the cardinal ligaments followed by the ovarian vessels bilaterally with subsequent removal of the uterus with the tubes and  ovaries attached. Posteriorly, 0 Vicryl sutures were placed to the uterosacral ligaments bilaterally and second 0 Vicryl suture was placed posteriorly to approximate the dead space from the posterior cul-de-sac with a suture coming out through posteriorly.  Once this was done, the bladder peritoneum was identified.  The vagina was then closed vertically with 0 Vicryl figure-of-eight sutures interrupted stitches.  Once this was closed, the vagina was irrigated.  Attention was then turned back to the abdomen.  The abdomen was then re-insufflated.  The lighted video laparoscope was inserted.  The abdomen was then copiously irrigated, suctioned.  Good hemostasis was noted.  Interceed was placed overlying the vaginal cuff.  At that point, the lower incision port sites were then taken out.  Infraumbilical site was taken out under direct visualization.  A 0 Vicryl figure-of-eight sutures placed in the fascia at the infraumbilical site and the skin incisions were approximated with 4-0 Vicryl subcuticular stitches and Dermabond placed.  SPECIMENS:  Uterus with cervix, tubes and ovaries bilaterally sent to Pathology.  ESTIMATED BLOOD LOSS:  200 mL.  INTRAOPERATIVE FLUID:  2 L.  URINE OUTPUT:  200 mL clear yellow urine.  COUNTS:  Sponge and instrument counts x2 was correct.  COMPLICATIONS:  None.  The patient tolerated the procedure well and was transferred to the recovery room in stable condition.     Maxie Better, M.D.     [place]/MEDQ  D:  01/17/2013  T:  01/17/2013  Job:  [id number]

## 2013-01-19 ENCOUNTER — Encounter (HOSPITAL_COMMUNITY): Payer: Self-pay | Admitting: Obstetrics and Gynecology

## 2013-02-11 ENCOUNTER — Telehealth: Payer: Self-pay | Admitting: Family Medicine

## 2013-02-11 DIAGNOSIS — F909 Attention-deficit hyperactivity disorder, unspecified type: Secondary | ICD-10-CM

## 2013-02-11 MED ORDER — AMPHETAMINE-DEXTROAMPHETAMINE 20 MG PO TABS: 20.0000 mg | ORAL_TABLET | Freq: Two times a day (BID) | ORAL | Status: DC

## 2013-02-11 NOTE — Telephone Encounter (Signed)
Adderall renewed.

## 2013-02-11 NOTE — Telephone Encounter (Signed)
Pt needs refill for adderall she will call and see if ready this pm   Pt # [phone]

## 2013-02-19 ENCOUNTER — Other Ambulatory Visit: Payer: Self-pay | Admitting: Family Medicine

## 2013-02-21 ENCOUNTER — Other Ambulatory Visit: Payer: Self-pay | Admitting: Family Medicine

## 2013-02-23 ENCOUNTER — Other Ambulatory Visit: Payer: Self-pay

## 2013-02-23 MED ORDER — ZOLPIDEM TARTRATE 10 MG PO TABS: ORAL_TABLET | ORAL | Status: DC

## 2013-02-23 NOTE — Telephone Encounter (Signed)
Is this ok?

## 2013-02-23 NOTE — Telephone Encounter (Signed)
CALLED AMBIEN IN PER JCL 

## 2013-02-23 NOTE — Telephone Encounter (Signed)
Renew this with 5 refills 

## 2013-02-24 ENCOUNTER — Encounter: Payer: Self-pay | Admitting: Family Medicine

## 2013-02-24 ENCOUNTER — Ambulatory Visit (INDEPENDENT_AMBULATORY_CARE_PROVIDER_SITE_OTHER): Payer: BC Managed Care – PPO | Admitting: Family Medicine

## 2013-02-24 VITALS — BP 140/90 | HR 64 | Wt 162.0 lb

## 2013-02-24 DIAGNOSIS — Z8639 Personal history of other endocrine, nutritional and metabolic disease: Secondary | ICD-10-CM

## 2013-02-24 DIAGNOSIS — Z90710 Acquired absence of both cervix and uterus: Secondary | ICD-10-CM

## 2013-02-24 DIAGNOSIS — L659 Nonscarring hair loss, unspecified: Secondary | ICD-10-CM

## 2013-02-24 LAB — TSH: TSH: 2.494 u[IU]/mL (ref 0.350–4.500)

## 2013-02-24 NOTE — Progress Notes (Signed)
  Subjective:    Patient ID: [name], female    DOB: [birth year], 47 y.o.   MRN: [id number]  HPI She is here for medication management visit. She had a hysterectomy February 21. After that she was giving estrogen patches which she had trouble staying on and was switched approximately 10 days ago to Premarin. She notices that she is having more difficulty with emotional swings and hot flashes. She is scheduled to see her gynecologist again tomorrow. She also notes more hair loss . She is concerned about thyroid.she has had no skin changes, hot or cold intolerance, constipation or mental slowness. She does have a previous history of vitamin D deficiency and was given 50,000 units for 8 weeks however apparently she did not get followup on this. This was several years ago.   Review of Systems     Objective:   Physical Exam Alert and in no distress. Exam of the skin shows no major changes. DTRs are normal. 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

## 2013-02-25 ENCOUNTER — Other Ambulatory Visit: Payer: Self-pay | Admitting: *Deleted

## 2013-02-25 DIAGNOSIS — E559 Vitamin D deficiency, unspecified: Secondary | ICD-10-CM

## 2013-02-25 LAB — VITAMIN D 25 HYDROXY (VIT D DEFICIENCY, FRACTURES): Vit D, 25-Hydroxy: 18 ng/mL — ABNORMAL LOW (ref 30–89)

## 2013-02-25 MED ORDER — ERGOCALCIFEROL 1.25 MG (50000 UT) PO CAPS: 50000.0000 [IU] | ORAL_CAPSULE | ORAL | Status: DC

## 2013-02-27 ENCOUNTER — Other Ambulatory Visit: Payer: Self-pay | Admitting: Family Medicine

## 2013-02-27 NOTE — Telephone Encounter (Signed)
Is this ok?

## 2013-04-13 ENCOUNTER — Other Ambulatory Visit: Payer: Self-pay | Admitting: Family Medicine

## 2013-04-13 NOTE — Telephone Encounter (Signed)
IS THIS OK 

## 2013-04-29 ENCOUNTER — Other Ambulatory Visit: Payer: Self-pay

## 2013-04-30 ENCOUNTER — Other Ambulatory Visit: Payer: BC Managed Care – PPO

## 2013-04-30 DIAGNOSIS — E559 Vitamin D deficiency, unspecified: Secondary | ICD-10-CM

## 2013-05-01 LAB — VITAMIN D 25 HYDROXY (VIT D DEFICIENCY, FRACTURES): Vit D, 25-Hydroxy: 44 ng/mL (ref 30–89)

## 2013-05-25 ENCOUNTER — Telehealth: Payer: Self-pay | Admitting: Family Medicine

## 2013-05-27 NOTE — Telephone Encounter (Signed)
LM

## 2013-07-24 ENCOUNTER — Telehealth: Payer: Self-pay | Admitting: Family Medicine

## 2013-07-24 NOTE — Telephone Encounter (Signed)
lm

## 2013-07-28 ENCOUNTER — Institutional Professional Consult (permissible substitution): Payer: BC Managed Care – PPO | Admitting: Family Medicine

## 2013-08-27 ENCOUNTER — Ambulatory Visit: Payer: BC Managed Care – PPO | Admitting: Emergency Medicine

## 2013-08-27 VITALS — BP 120/80 | HR 100 | Temp 98.3°F | Resp 16 | Ht 61.0 in | Wt 171.0 lb

## 2013-08-27 DIAGNOSIS — R350 Frequency of micturition: Secondary | ICD-10-CM

## 2013-08-27 DIAGNOSIS — N3000 Acute cystitis without hematuria: Secondary | ICD-10-CM

## 2013-08-27 LAB — POCT UA - MICROSCOPIC ONLY
Crystals, Ur, HPF, POC: NEGATIVE
Mucus, UA: NEGATIVE
Yeast, UA: NEGATIVE

## 2013-08-27 LAB — POCT URINALYSIS DIPSTICK
Bilirubin, UA: NEGATIVE
Blood, UA: NEGATIVE
Glucose, UA: NEGATIVE
Ketones, UA: NEGATIVE
Nitrite, UA: POSITIVE
Spec Grav, UA: 1.025
Urobilinogen, UA: 0.2
pH, UA: 6

## 2013-08-27 MED ORDER — CIPROFLOXACIN HCL 500 MG PO TABS: 500.0000 mg | ORAL_TABLET | Freq: Two times a day (BID) | ORAL | Status: DC

## 2013-08-27 MED ORDER — PHENAZOPYRIDINE HCL 200 MG PO TABS: 200.0000 mg | ORAL_TABLET | Freq: Three times a day (TID) | ORAL | Status: DC | PRN

## 2013-08-27 MED ORDER — FLUCONAZOLE 150 MG PO TABS: 150.0000 mg | ORAL_TABLET | Freq: Once | ORAL | Status: DC

## 2013-08-27 NOTE — Patient Instructions (Addendum)
Urinary Tract Infection  Urinary tract infections (UTIs) can develop anywhere along your urinary tract. Your urinary tract is your body's drainage system for removing wastes and extra water. Your urinary tract includes two kidneys, two ureters, a bladder, and a urethra. Your kidneys are a pair of bean-shaped organs. Each kidney is about the size of your fist. They are located below your ribs, one on each side of your spine.  CAUSES  Infections are caused by microbes, which are microscopic organisms, including fungi, viruses, and bacteria. These organisms are so small that they can only be seen through a microscope. Bacteria are the microbes that most commonly cause UTIs.  SYMPTOMS   Symptoms of UTIs may vary by age and gender of the patient and by the location of the infection. Symptoms in young women typically include a frequent and intense urge to urinate and a painful, burning feeling in the bladder or urethra during urination. Older women and men are more likely to be tired, shaky, and weak and have muscle aches and abdominal pain. A fever may mean the infection is in your kidneys. Other symptoms of a kidney infection include pain in your back or sides below the ribs, nausea, and vomiting.  DIAGNOSIS  To diagnose a UTI, your caregiver will ask you about your symptoms. Your caregiver also will ask to provide a urine sample. The urine sample will be tested for bacteria and white blood cells. White blood cells are made by your body to help fight infection.  TREATMENT   Typically, UTIs can be treated with medication. Because most UTIs are caused by a bacterial infection, they usually can be treated with the use of antibiotics. The choice of antibiotic and length of treatment depend on your symptoms and the type of bacteria causing your infection.  HOME CARE INSTRUCTIONS   If you were prescribed antibiotics, take them exactly as your caregiver instructs you. Finish the medication even if you feel better after you  have only taken some of the medication.   Drink enough water and fluids to keep your urine clear or pale yellow.   Avoid caffeine, tea, and carbonated beverages. They tend to irritate your bladder.   Empty your bladder often. Avoid holding urine for long periods of time.   Empty your bladder before and after sexual intercourse.   After a bowel movement, women should cleanse from front to back. Use each tissue only once.  SEEK MEDICAL CARE IF:    You have back pain.   You develop a fever.   Your symptoms do not begin to resolve within 3 days.  SEEK IMMEDIATE MEDICAL CARE IF:    You have severe back pain or lower abdominal pain.   You develop chills.   You have nausea or vomiting.   You have continued burning or discomfort with urination.  MAKE SURE YOU:    Understand these instructions.   Will watch your condition.   Will get help right away if you are not doing well or get worse.  Document Released: 08/22/2005 Document Revised: 05/13/2012 Document Reviewed: 12/21/2011  [employer] Patient Information 2014 [employer]

## 2013-08-27 NOTE — Progress Notes (Signed)
[facility] [address] [place] [address] [phone]  Date:  08/27/2013   Name:  [name]   DOB:  16-Jan-1966   MRN:  [id number]  PCP:  Carollee Herter, MD    Chief Complaint: Urinary Frequency   History of Present Illness:  [name] is a 47 y.o. very pleasant female patient who presents with the following:  Dysuria, urgency and frequency for 2 days.  Having gravity incontinence issues.  No nausea or vomiting.  No dyspareunia or discharge.  No fever or chills.  No improvement with over the counter medications or other home remedies. Denies other complaint or health concern today.   Patient Active Problem List   Diagnosis Date Noted  . S/P laparoscopic assisted vaginal hysterectomy (LAVH) 01/16/2013  . ADHD (attention deficit hyperactivity disorder) 04/01/2012  . Fibromyalgia muscle pain 09/10/2011    Past Medical History  Diagnosis Date  . Sleep disturbances   . History of migraine headaches   . Depression   . Anxiety   . Headache(784.0)   . Fibromyalgia     chronic fatigue, lower back pain,  . Fibromyalgia   . Complication of anesthesia     pt states medications make her hyper instead of sleepy    Past Surgical History  Procedure Laterality Date  . Tubal ligation    . Laparoscopic assisted vaginal hysterectomy N/A 01/16/2013    Procedure: LAPAROSCOPIC ASSISTED VAGINAL HYSTERECTOMY;  Surgeon: Serita Kyle, MD;  Location: [facility];  Service: Gynecology;  Laterality: N/A;  . Salpingoophorectomy Bilateral 01/16/2013    Procedure: SALPINGO OOPHORECTOMY;  Surgeon: Serita Kyle, MD;  Location: [facility];  Service: Gynecology;  Laterality: Bilateral;    History  Substance Use Topics  . Smoking status: Never Smoker   . Smokeless tobacco: Never Used  . Alcohol Use: Yes     Comment: 4 drinks per weekend.    Family History  Problem Relation Age of Onset  . Cancer Paternal Aunt   . Cancer Maternal  Grandmother     Allergies  Allergen Reactions  . Codeine Itching  . Sulfur Itching    Medication list has been reviewed and updated.  Current Outpatient Prescriptions on File Prior to Visit  Medication Sig Dispense Refill  . amphetamine-dextroamphetamine (ADDERALL) 20 MG tablet Take 1 tablet (20 mg total) by mouth 2 (two) times daily.  60 tablet  0  . amphetamine-dextroamphetamine (ADDERALL) 20 MG tablet Take 1 tablet (20 mg total) by mouth 2 (two) times daily.  60 tablet  0  . amphetamine-dextroamphetamine (ADDERALL) 20 MG tablet Take 1 tablet (20 mg total) by mouth 2 (two) times daily.  60 tablet  0  . cyclobenzaprine (FLEXERIL) 10 MG tablet TAKE ONE TABLET BY MOUTH EVERY DAY  90 tablet  3  . estrogens, conjugated, (PREMARIN) 0.625 MG tablet Take 0.625 mg by mouth daily. Take daily for 21 days then do not take for 7 days.      . meloxicam (MOBIC) 7.5 MG tablet TAKE ONE TABLET BY MOUTH EVERY DAY  30 tablet  5  . sertraline (ZOLOFT) 100 MG tablet Take 50 mg by mouth daily.       . traMADol (ULTRAM) 50 MG tablet TAKE ONE TABLET BY MOUTH EVERY 6 HOURS AS NEEDED FOR PAIN  60 tablet  5  . valACYclovir (VALTREX) 1000 MG tablet Take 1,000 mg by mouth 2 (two) times daily.      Marland Kitchen zolpidem (AMBIEN) 10 MG  tablet TAKE ONE TABLET BY MOUTH AT BEDTIME AS NEEDED  30 tablet  5  . ergocalciferol (VITAMIN D2) 50000 UNITS capsule Take 1 capsule (50,000 Units total) by mouth once a week.  8 capsule  0  . metroNIDAZOLE (FLAGYL) 500 MG tablet Take 500 mg by mouth 3 (three) times daily.       No current facility-administered medications on file prior to visit.    Review of Systems:  As per HPI, otherwise negative.    Physical Examination: Filed Vitals:   08/27/13 1924  BP: 120/80  Pulse: 100  Temp: 98.3 F (36.8 C)  Resp: 16   Filed Vitals:   08/27/13 1924  Height: 5\' 1"  (1.549 m)  Weight: 171 lb (77.565 kg)   Body mass index is 32.33 kg/(m^2). Ideal Body Weight: Weight in (lb) to have BMI  = 25: 132   GEN: WDWN, NAD, Non-toxic, Alert & Oriented x 3 HEENT: Atraumatic, Normocephalic.  Ears and Nose: No external deformity. EXTR: No clubbing/cyanosis/edema NEURO: Normal gait.  PSYCH: Normally interactive. Conversant. Not depressed or anxious appearing.  Calm demeanor.  ABDOMEN:  Soft, not tender, no cva tenderness.  Assessment and Plan: Acute cystitis Pyridium cipro  Signed,  Phillips Odor, MD   Results for orders placed in visit on 08/27/13  POCT UA - MICROSCOPIC ONLY      Result Value Range   WBC, Ur, HPF, POC tntc     RBC, urine, microscopic 0-2     Bacteria, U Microscopic 4+     Mucus, UA neg     Epithelial cells, urine per micros 0-2     Crystals, Ur, HPF, POC neg     Casts, Ur, LPF, POC renal tubular     Yeast, UA neg    POCT URINALYSIS DIPSTICK      Result Value Range   Color, UA yellow     Clarity, UA hazy     Glucose, UA neg     Bilirubin, UA neg     Ketones, UA neg     Spec Grav, UA 1.025     Blood, UA neg     pH, UA 6.0     Protein, UA trace     Urobilinogen, UA 0.2     Nitrite, UA positive     Leukocytes, UA small (1+)

## 2013-09-03 ENCOUNTER — Telehealth: Payer: Self-pay | Admitting: Family Medicine

## 2013-09-03 DIAGNOSIS — F909 Attention-deficit hyperactivity disorder, unspecified type: Secondary | ICD-10-CM

## 2013-09-03 MED ORDER — TRAMADOL HCL 50 MG PO TABS: ORAL_TABLET | ORAL | Status: DC

## 2013-09-03 MED ORDER — AMPHETAMINE-DEXTROAMPHETAMINE 20 MG PO TABS: 20.0000 mg | ORAL_TABLET | Freq: Two times a day (BID) | ORAL | Status: DC

## 2013-09-03 NOTE — Telephone Encounter (Signed)
Pt called requesting refill for Adderall 20 mg of 3 month supply and a refill on Tramadol 50 mg # 60.  She does not want the Tramadol called in, and would prefer a written script so she is able to take it to a different pharmacy. Please notify pt when ready.

## 2013-09-22 ENCOUNTER — Other Ambulatory Visit: Payer: Self-pay | Admitting: Family Medicine

## 2013-09-22 NOTE — Telephone Encounter (Signed)
Is this ok?

## 2013-09-25 ENCOUNTER — Telehealth: Payer: Self-pay | Admitting: Internal Medicine

## 2013-09-25 NOTE — Telephone Encounter (Signed)
Called in ambien to pharmacy

## 2013-10-01 ENCOUNTER — Other Ambulatory Visit: Payer: Self-pay

## 2013-12-28 ENCOUNTER — Other Ambulatory Visit: Payer: Self-pay

## 2013-12-28 DIAGNOSIS — Z1231 Encounter for screening mammogram for malignant neoplasm of breast: Secondary | ICD-10-CM

## 2014-01-12 ENCOUNTER — Ambulatory Visit (INDEPENDENT_AMBULATORY_CARE_PROVIDER_SITE_OTHER): Payer: BC Managed Care – PPO | Admitting: Family Medicine

## 2014-01-12 ENCOUNTER — Encounter: Payer: Self-pay | Admitting: Family Medicine

## 2014-01-12 VITALS — BP 120/80 | HR 104 | Wt 170.0 lb

## 2014-01-12 DIAGNOSIS — IMO0001 Reserved for inherently not codable concepts without codable children: Secondary | ICD-10-CM

## 2014-01-12 DIAGNOSIS — Z79899 Other long term (current) drug therapy: Secondary | ICD-10-CM

## 2014-01-12 DIAGNOSIS — Z8639 Personal history of other endocrine, nutritional and metabolic disease: Secondary | ICD-10-CM

## 2014-01-12 DIAGNOSIS — M797 Fibromyalgia: Secondary | ICD-10-CM

## 2014-01-12 DIAGNOSIS — N951 Menopausal and female climacteric states: Secondary | ICD-10-CM

## 2014-01-12 DIAGNOSIS — R232 Flushing: Secondary | ICD-10-CM

## 2014-01-12 DIAGNOSIS — F909 Attention-deficit hyperactivity disorder, unspecified type: Secondary | ICD-10-CM

## 2014-01-12 LAB — CBC WITH DIFFERENTIAL/PLATELET
Basophils Absolute: 0 10*3/uL (ref 0.0–0.1)
Basophils Relative: 0 % (ref 0–1)
Eosinophils Absolute: 0.1 10*3/uL (ref 0.0–0.7)
Eosinophils Relative: 4 % (ref 0–5)
HCT: 35.2 % — ABNORMAL LOW (ref 36.0–46.0)
Hemoglobin: 11.6 g/dL — ABNORMAL LOW (ref 12.0–15.0)
Lymphocytes Relative: 37 % (ref 12–46)
Lymphs Abs: 1.5 10*3/uL (ref 0.7–4.0)
MCH: 26.6 pg (ref 26.0–34.0)
MCHC: 33 g/dL (ref 30.0–36.0)
MCV: 80.7 fL (ref 78.0–100.0)
Monocytes Absolute: 0.4 10*3/uL (ref 0.1–1.0)
Monocytes Relative: 9 % (ref 3–12)
Neutro Abs: 1.9 10*3/uL (ref 1.7–7.7)
Neutrophils Relative %: 50 % (ref 43–77)
Platelets: 261 10*3/uL (ref 150–400)
RBC: 4.36 MIL/uL (ref 3.87–5.11)
RDW: 14.5 % (ref 11.5–15.5)
WBC: 3.9 10*3/uL — ABNORMAL LOW (ref 4.0–10.5)

## 2014-01-12 LAB — COMPREHENSIVE METABOLIC PANEL
ALT: 14 U/L (ref 0–35)
AST: 23 U/L (ref 0–37)
Albumin: 3.7 g/dL (ref 3.5–5.2)
Alkaline Phosphatase: 99 U/L (ref 39–117)
BUN: 11 mg/dL (ref 6–23)
CO2: 28 mEq/L (ref 19–32)
Calcium: 9.3 mg/dL (ref 8.4–10.5)
Chloride: 105 mEq/L (ref 96–112)
Creat: 0.65 mg/dL (ref 0.50–1.10)
Glucose, Bld: 97 mg/dL (ref 70–99)
Potassium: 4.2 mEq/L (ref 3.5–5.3)
Sodium: 141 mEq/L (ref 135–145)
Total Bilirubin: 0.2 mg/dL (ref 0.2–1.2)
Total Protein: 6.3 g/dL (ref 6.0–8.3)

## 2014-01-12 LAB — LIPID PANEL
Cholesterol: 219 mg/dL — ABNORMAL HIGH (ref 0–200)
HDL: 63 mg/dL (ref >39)
LDL Cholesterol: 137 mg/dL — ABNORMAL HIGH (ref 0–99)
Total CHOL/HDL Ratio: 3.5 Ratio
Triglycerides: 96 mg/dL (ref <150)
VLDL: 19 mg/dL (ref 0–40)

## 2014-01-12 MED ORDER — SERTRALINE HCL 100 MG PO TABS: ORAL_TABLET | ORAL | Status: DC

## 2014-01-12 MED ORDER — AMPHETAMINE-DEXTROAMPHETAMINE 30 MG PO TABS: 30.0000 mg | ORAL_TABLET | Freq: Two times a day (BID) | ORAL | Status: DC

## 2014-01-12 NOTE — Patient Instructions (Signed)
Let me know how you do on the higher dose of the Zoloft and also the Adderall. Let me know how the Adderall is doing in regards of whether it works, how long it works and if you have any problems with it.

## 2014-01-12 NOTE — Progress Notes (Signed)
   Subjective:    Patient ID: [name], female    DOB: 27-Apr-1966, 48 y.o.   MRN: [id number]  HPI She is here for an interval evaluation. She does have underlying fibromyalgia and notes that when she gets stressed, she has worsening of her symptoms. He does use tramadol mainly for control of this. She has been on Zoloft for treatment of a mood disorder. She also is having hot flash symptoms and dates this to when she had her hysterectomy. She also complains of intermittent nausea and dizziness but cannot relate this to any particular. She presently is on estrogen replacement. She also has a previous history of vitamin D deficiency and has been taking 2000 units of vitamin D daily. She has a history of ADHD and has been on 20 twice a day of Adderall. She states that it seems to have worn off and now she notes no real improvement in her symptoms of increased back to see an inability to accomplish the tasks. She rates many notes but then forgets where she puts her notes.   Review of Systems Negative except as above    Objective:   Physical Exam alert and in no distress. Tympanic membranes and canals are normal. Throat is clear. Tonsils are normal. Neck is supple without adenopathy or thyromegaly. Cardiac exam shows a regular sinus rhythm without murmurs or gallops. Lungs are clear to auscultation. Abdominal exam shows no masses or tenderness.        Assessment & Plan:  ADHD (attention deficit hyperactivity disorder) - Plan: amphetamine-dextroamphetamine (ADDERALL) 30 MG tablet  Fibromyalgia muscle pain - Plan: sertraline (ZOLOFT) 100 MG tablet  History of vitamin D deficiency - Plan: Vit D  25 hydroxy (rtn osteoporosis monitoring)  Hot flashes - Plan: sertraline (ZOLOFT) 100 MG tablet  Encounter for long-term (current) use of other medications - Plan: CBC with Differential, Comprehensive metabolic panel, Lipid panel  I will initially have her take 1-1/2 Zoloft at 50 mg for the  next week or 2 and let me know before she goes to potentially taken the 400 mg. She is also to increase her Adderall to 30 mg twice a day and let me know how she does on that.

## 2014-01-13 LAB — VITAMIN D 25 HYDROXY (VIT D DEFICIENCY, FRACTURES): Vit D, 25-Hydroxy: 71 ng/mL (ref 30–89)

## 2014-01-16 ENCOUNTER — Other Ambulatory Visit: Payer: Self-pay | Admitting: Family Medicine

## 2014-01-18 ENCOUNTER — Other Ambulatory Visit: Payer: Self-pay

## 2014-01-18 ENCOUNTER — Other Ambulatory Visit: Payer: Self-pay | Admitting: Family Medicine

## 2014-01-18 ENCOUNTER — Ambulatory Visit: Payer: BC Managed Care – PPO

## 2014-01-18 ENCOUNTER — Telehealth: Payer: Self-pay | Admitting: Family Medicine

## 2014-01-18 MED ORDER — TRAMADOL HCL 50 MG PO TABS: ORAL_TABLET | ORAL | Status: DC

## 2014-01-18 NOTE — Telephone Encounter (Signed)
zoloft and adderall was prescribed at visit

## 2014-01-18 NOTE — Telephone Encounter (Signed)
Called out tramadol with 5 refill

## 2014-01-18 NOTE — Telephone Encounter (Signed)
Is this ok to refill?  

## 2014-01-18 NOTE — Telephone Encounter (Signed)
Okay to renew with 5 refills

## 2014-01-19 NOTE — Telephone Encounter (Signed)
keba called in med to pharmacy

## 2014-01-20 ENCOUNTER — Telehealth: Payer: Self-pay | Admitting: Family Medicine

## 2014-01-20 MED ORDER — ESTROGENS CONJUGATED 0.625 MG PO TABS: 0.6250 mg | ORAL_TABLET | Freq: Every day | ORAL | Status: DC

## 2014-01-20 MED ORDER — MELOXICAM 7.5 MG PO TABS: ORAL_TABLET | ORAL | Status: DC

## 2014-01-20 NOTE — Telephone Encounter (Signed)
i have sent in current dosing of estrogen. Pt was told if it needs to be increased, appt has to be set up

## 2014-01-20 NOTE — Telephone Encounter (Signed)
Her meloxicam will be renewed. I will continue her on her present dosing of estrogen but may consider increasing it at a later date

## 2014-01-20 NOTE — Telephone Encounter (Signed)
D I called and meloxicam. My notes don't indicate that I was going to increase her estrogen. If she wants to discuss that further, have her set up an appointment

## 2014-01-20 NOTE — Addendum Note (Signed)
Addended by: Pernell Dupre A on: 01/20/2014 01:29 PM   Modules accepted: Orders

## 2014-02-08 ENCOUNTER — Ambulatory Visit
Admission: RE | Admit: 2014-02-08 | Discharge: 2014-02-08 | Disposition: A | Discharge status: Home / Self Care | Payer: BC Managed Care – PPO | Source: Ambulatory Visit

## 2014-02-08 DIAGNOSIS — Z1231 Encounter for screening mammogram for malignant neoplasm of breast: Secondary | ICD-10-CM

## 2014-02-19 ENCOUNTER — Other Ambulatory Visit: Payer: Self-pay | Admitting: Family Medicine

## 2014-02-25 ENCOUNTER — Emergency Department (HOSPITAL_BASED_OUTPATIENT_CLINIC_OR_DEPARTMENT_OTHER): Payer: BC Managed Care – PPO

## 2014-02-25 ENCOUNTER — Emergency Department (HOSPITAL_BASED_OUTPATIENT_CLINIC_OR_DEPARTMENT_OTHER)
Admission: EM | Admit: 2014-02-25 | Discharge: 2014-02-25 | Disposition: A | Discharge status: Home / Self Care | Payer: BC Managed Care – PPO | Attending: Emergency Medicine | Admitting: Emergency Medicine

## 2014-02-25 ENCOUNTER — Encounter (HOSPITAL_BASED_OUTPATIENT_CLINIC_OR_DEPARTMENT_OTHER): Payer: Self-pay | Admitting: Emergency Medicine

## 2014-02-25 DIAGNOSIS — Z79899 Other long term (current) drug therapy: Secondary | ICD-10-CM | POA: Insufficient documentation

## 2014-02-25 DIAGNOSIS — F3289 Other specified depressive episodes: Secondary | ICD-10-CM | POA: Insufficient documentation

## 2014-02-25 DIAGNOSIS — Z8679 Personal history of other diseases of the circulatory system: Secondary | ICD-10-CM | POA: Insufficient documentation

## 2014-02-25 DIAGNOSIS — Z90710 Acquired absence of both cervix and uterus: Secondary | ICD-10-CM | POA: Insufficient documentation

## 2014-02-25 DIAGNOSIS — F411 Generalized anxiety disorder: Secondary | ICD-10-CM | POA: Insufficient documentation

## 2014-02-25 DIAGNOSIS — Z792 Long term (current) use of antibiotics: Secondary | ICD-10-CM | POA: Insufficient documentation

## 2014-02-25 DIAGNOSIS — R1033 Periumbilical pain: Secondary | ICD-10-CM | POA: Insufficient documentation

## 2014-02-25 DIAGNOSIS — IMO0001 Reserved for inherently not codable concepts without codable children: Secondary | ICD-10-CM | POA: Insufficient documentation

## 2014-02-25 DIAGNOSIS — F329 Major depressive disorder, single episode, unspecified: Secondary | ICD-10-CM | POA: Insufficient documentation

## 2014-02-25 DIAGNOSIS — R630 Anorexia: Secondary | ICD-10-CM | POA: Insufficient documentation

## 2014-02-25 DIAGNOSIS — R109 Unspecified abdominal pain: Secondary | ICD-10-CM

## 2014-02-25 DIAGNOSIS — R110 Nausea: Secondary | ICD-10-CM | POA: Insufficient documentation

## 2014-02-25 LAB — CBC WITH DIFFERENTIAL/PLATELET
Basophils Absolute: 0 10*3/uL (ref 0.0–0.1)
Basophils Relative: 1 % (ref 0–1)
Eosinophils Absolute: 0.2 10*3/uL (ref 0.0–0.7)
Eosinophils Relative: 3 % (ref 0–5)
HCT: 37.2 % (ref 36.0–46.0)
Hemoglobin: 12.1 g/dL (ref 12.0–15.0)
Lymphocytes Relative: 31 % (ref 12–46)
Lymphs Abs: 1.7 10*3/uL (ref 0.7–4.0)
MCH: 27.5 pg (ref 26.0–34.0)
MCHC: 32.5 g/dL (ref 30.0–36.0)
MCV: 84.5 fL (ref 78.0–100.0)
Monocytes Absolute: 0.7 10*3/uL (ref 0.1–1.0)
Monocytes Relative: 12 % (ref 3–12)
Neutro Abs: 3 10*3/uL (ref 1.7–7.7)
Neutrophils Relative %: 53 % (ref 43–77)
Platelets: 220 10*3/uL (ref 150–400)
RBC: 4.4 MIL/uL (ref 3.87–5.11)
RDW: 14.2 % (ref 11.5–15.5)
WBC: 5.6 10*3/uL (ref 4.0–10.5)

## 2014-02-25 LAB — URINALYSIS, ROUTINE W REFLEX MICROSCOPIC
Bilirubin Urine: NEGATIVE
Glucose, UA: NEGATIVE mg/dL
Hgb urine dipstick: NEGATIVE
Ketones, ur: NEGATIVE mg/dL
Leukocytes, UA: NEGATIVE
Nitrite: NEGATIVE
Protein, ur: NEGATIVE mg/dL
Specific Gravity, Urine: 1.017 (ref 1.005–1.030)
Urobilinogen, UA: 0.2 mg/dL (ref 0.0–1.0)
pH: 7 (ref 5.0–8.0)

## 2014-02-25 LAB — COMPREHENSIVE METABOLIC PANEL
ALT: 17 U/L (ref 0–35)
AST: 26 U/L (ref 0–37)
Albumin: 3.6 g/dL (ref 3.5–5.2)
Alkaline Phosphatase: 112 U/L (ref 39–117)
BUN: 14 mg/dL (ref 6–23)
CO2: 26 mEq/L (ref 19–32)
Calcium: 9.6 mg/dL (ref 8.4–10.5)
Chloride: 104 mEq/L (ref 96–112)
Creatinine, Ser: 0.7 mg/dL (ref 0.50–1.10)
GFR calc Af Amer: >90 mL/min (ref >90)
GFR calc non Af Amer: >90 mL/min (ref >90)
Glucose, Bld: 97 mg/dL (ref 70–99)
Potassium: 4.2 mEq/L (ref 3.7–5.3)
Sodium: 142 mEq/L (ref 137–147)
Total Bilirubin: <0.2 mg/dL — ABNORMAL LOW (ref 0.3–1.2)
Total Protein: 7.5 g/dL (ref 6.0–8.3)

## 2014-02-25 LAB — LIPASE, BLOOD: Lipase: 16 U/L (ref 11–59)

## 2014-02-25 MED ORDER — IOHEXOL 300 MG/ML  SOLN
100.0000 mL | Freq: Once | INTRAMUSCULAR | Status: AC | PRN
  Administered 2014-02-25: 100 mL via INTRAVENOUS

## 2014-02-25 MED ORDER — HYDROCODONE-ACETAMINOPHEN 5-325 MG PO TABS: 2.0000 | ORAL_TABLET | ORAL | Status: DC | PRN

## 2014-02-25 MED ORDER — MORPHINE SULFATE 4 MG/ML IJ SOLN
4.0000 mg | Freq: Once | INTRAMUSCULAR | Status: AC
  Administered 2014-02-25: 4 mg via INTRAVENOUS
  Filled 2014-02-25: qty 1

## 2014-02-25 MED ORDER — ONDANSETRON HCL 4 MG/2ML IJ SOLN
4.0000 mg | Freq: Once | INTRAMUSCULAR | Status: AC
  Administered 2014-02-25: 4 mg via INTRAVENOUS
  Filled 2014-02-25: qty 2

## 2014-02-25 MED ORDER — IOHEXOL 300 MG/ML  SOLN
50.0000 mL | Freq: Once | INTRAMUSCULAR | Status: AC | PRN
  Administered 2014-02-25: 50 mL via ORAL

## 2014-02-25 NOTE — Discharge Instructions (Signed)
Abdominal Pain, Adult °Many things can cause abdominal pain. Usually, abdominal pain is not caused by a disease and will improve without treatment. It can often be observed and treated at home. Your health care provider will do a physical exam and possibly order blood tests and X-rays to help determine the seriousness of your pain. However, in many cases, more time must pass before a clear cause of the pain can be found. Before that point, your health care provider may not know if you need more testing or further treatment. °HOME CARE INSTRUCTIONS  °Monitor your abdominal pain for any changes. The following actions may help to alleviate any discomfort you are experiencing: °· Only take over-the-counter or prescription medicines as directed by your health care provider. °· Do not take laxatives unless directed to do so by your health care provider. °· Try a clear liquid diet (broth, tea, or water) as directed by your health care provider. Slowly move to a bland diet as tolerated. °SEEK MEDICAL CARE IF: °· You have unexplained abdominal pain. °· You have abdominal pain associated with nausea or diarrhea. °· You have pain when you urinate or have a bowel movement. °· You experience abdominal pain that wakes you in the night. °· You have abdominal pain that is worsened or improved by eating food. °· You have abdominal pain that is worsened with eating fatty foods. °SEEK IMMEDIATE MEDICAL CARE IF:  °· Your pain does not go away within 2 hours. °· You have a fever. °· You keep throwing up (vomiting). °· Your pain is felt only in portions of the abdomen, such as the right side or the left lower portion of the abdomen. °· You pass bloody or black tarry stools. °MAKE SURE YOU: °· Understand these instructions.   °· Will watch your condition.   °· Will get help right away if you are not doing well or get worse.   °Document Released: 08/22/2005 Document Revised: 09/02/2013 Document Reviewed: 07/22/2013 °[employer]® Patient  Information ©2014 [employer]. ° °

## 2014-02-25 NOTE — ED Provider Notes (Signed)
CSN: [id number]     Arrival date & time 02/25/14  1909 History   First MD Initiated Contact with Patient 02/25/14 1930     Chief Complaint  Patient presents with  . Abdominal Pain     (Consider location/radiation/quality/duration/timing/severity/associated sxs/prior Treatment) HPI Comments: Pt states that she has been having ruq pain(when pt points the pt points periumbilical area) for 3 days. Pain is worse with movement. Nausea, without vomiting, diarrhea, dysuria or fever. Pt states that she has had a decreased appetite. Has not tried any medications at home. Has had a hysterectomy  The history is provided by the patient. No language interpreter was used.    Past Medical History  Diagnosis Date  . Sleep disturbances   . History of migraine headaches   . Depression   . Anxiety   . Headache(784.0)   . Fibromyalgia     chronic fatigue, lower back pain,  . Fibromyalgia   . Complication of anesthesia     pt states medications make her hyper instead of sleepy   Past Surgical History  Procedure Laterality Date  . Tubal ligation    . Laparoscopic assisted vaginal hysterectomy N/A 01/16/2013    Procedure: LAPAROSCOPIC ASSISTED VAGINAL HYSTERECTOMY;  Surgeon: Marvene Staff, MD;  Location: [facility];  Service: Gynecology;  Laterality: N/A;  . Salpingoophorectomy Bilateral 01/16/2013    Procedure: SALPINGO OOPHORECTOMY;  Surgeon: Marvene Staff, MD;  Location: [facility];  Service: Gynecology;  Laterality: Bilateral;   Family History  Problem Relation Age of Onset  . Cancer Paternal Aunt   . Cancer Maternal Grandmother    History  Substance Use Topics  . Smoking status: Never Smoker   . Smokeless tobacco: Never Used  . Alcohol Use: Yes     Comment: 4 drinks per weekend.   OB History   Grav Para Term Preterm Abortions TAB SAB Ect Mult Living                 Review of Systems  Constitutional: Negative.   Respiratory: Negative.   Cardiovascular: Negative.        Allergies  Codeine and Sulfur  Home Medications   Current Outpatient Rx  Name  Route  Sig  Dispense  Refill  . amphetamine-dextroamphetamine (ADDERALL) 30 MG tablet   Oral   Take 1 tablet (30 mg total) by mouth 2 (two) times daily.   60 tablet   0   . ciprofloxacin (CIPRO) 500 MG tablet   Oral   Take 1 tablet (500 mg total) by mouth 2 (two) times daily.   20 tablet   0   . cyclobenzaprine (FLEXERIL) 10 MG tablet      TAKE ONE TABLET BY MOUTH EVERY DAY   90 tablet   3   . ergocalciferol (VITAMIN D2) 50000 UNITS capsule   Oral   Take 1 capsule (50,000 Units total) by mouth once a week.   8 capsule   0   . fluconazole (DIFLUCAN) 150 MG tablet   Oral   Take 1 tablet (150 mg total) by mouth once. Repeat if needed   2 tablet   0   . meloxicam (MOBIC) 7.5 MG tablet      TAKE ONE TABLET BY MOUTH EVERY DAY   30 tablet   5   . metroNIDAZOLE (FLAGYL) 500 MG tablet   Oral   Take 500 mg by mouth 3 (three) times daily.         . phenazopyridine (PYRIDIUM) 200  MG tablet   Oral   Take 1 tablet (200 mg total) by mouth 3 (three) times daily as needed for pain.   6 tablet   0   . PREMARIN 0.625 MG tablet      TAKE ONE TABLET BY MOUTH ONCE DAILY   30 tablet   0   . sertraline (ZOLOFT) 100 MG tablet      Take 1-1/2 tablets daily   45 tablet   11   . traMADol (ULTRAM) 50 MG tablet      TAKE ONE TABLET BY MOUTH EVERY 6 HOURS AS NEEDED FOR PAIN   60 tablet   5   . traMADol (ULTRAM) 50 MG tablet      TAKE ONE TABLET BY MOUTH EVERY 6 HOURS AS NEEDED FOR PAIN   60 tablet   5   . valACYclovir (VALTREX) 1000 MG tablet   Oral   Take 1,000 mg by mouth 2 (two) times daily.         Marland Kitchen zolpidem (AMBIEN) 10 MG tablet      TAKE ONE TABLET BY MOUTH AT BEDTIME AS NEEDED FOR SLEEP   90 tablet   3    BP 132/86  Pulse 98  Temp(Src) 98.3 F (36.8 C) (Oral)  Resp 16  Ht 5\' 1"  (1.549 m)  Wt 170 lb (77.111 kg)  BMI 32.14 kg/m2  SpO2 98%  LMP  01/12/2013 Physical Exam  Nursing note and vitals reviewed. Constitutional: She is oriented to person, place, and time. She appears well-developed and well-nourished.  Cardiovascular: Normal rate and regular rhythm.   Pulmonary/Chest: Effort normal and breath sounds normal.  Abdominal: Soft. Bowel sounds are normal. There is tenderness in the periumbilical area.  Musculoskeletal: Normal range of motion.  Neurological: She is alert and oriented to person, place, and time. Coordination normal.  Skin: Skin is warm and dry.  Psychiatric: She has a normal mood and affect.    ED Course  Procedures (including critical care time) Labs Review Labs Reviewed  COMPREHENSIVE METABOLIC PANEL - Abnormal; Notable for the following:    Total Bilirubin <0.2 (*)    All other components within normal limits  CBC WITH DIFFERENTIAL  LIPASE, BLOOD  URINALYSIS, ROUTINE W REFLEX MICROSCOPIC   Imaging Review No results found.   EKG Interpretation None      MDM   Final diagnoses:  None    Ct pending. Pt left with Dr. Brent General, NP 02/25/14 2204

## 2014-02-25 NOTE — ED Provider Notes (Signed)
Results for orders placed during the hospital encounter of 02/25/14  URINALYSIS, ROUTINE W REFLEX MICROSCOPIC      Result Value Ref Range   Color, Urine YELLOW  YELLOW   APPearance CLEAR  CLEAR   Specific Gravity, Urine 1.017  1.005 - 1.030   pH 7.0  5.0 - 8.0   Glucose, UA NEGATIVE  NEGATIVE mg/dL   Hgb urine dipstick NEGATIVE  NEGATIVE   Bilirubin Urine NEGATIVE  NEGATIVE   Ketones, ur NEGATIVE  NEGATIVE mg/dL   Protein, ur NEGATIVE  NEGATIVE mg/dL   Urobilinogen, UA 0.2  0.0 - 1.0 mg/dL   Nitrite NEGATIVE  NEGATIVE   Leukocytes, UA NEGATIVE  NEGATIVE  CBC WITH DIFFERENTIAL      Result Value Ref Range   WBC 5.6  4.0 - 10.5 K/uL   RBC 4.40  3.87 - 5.11 MIL/uL   Hemoglobin 12.1  12.0 - 15.0 g/dL   HCT 37.2  36.0 - 46.0 %   MCV 84.5  78.0 - 100.0 fL   MCH 27.5  26.0 - 34.0 pg   MCHC 32.5  30.0 - 36.0 g/dL   RDW 14.2  11.5 - 15.5 %   Platelets 220  150 - 400 K/uL   Neutrophils Relative % 53  43 - 77 %   Neutro Abs 3.0  1.7 - 7.7 K/uL   Lymphocytes Relative 31  12 - 46 %   Lymphs Abs 1.7  0.7 - 4.0 K/uL   Monocytes Relative 12  3 - 12 %   Monocytes Absolute 0.7  0.1 - 1.0 K/uL   Eosinophils Relative 3  0 - 5 %   Eosinophils Absolute 0.2  0.0 - 0.7 K/uL   Basophils Relative 1  0 - 1 %   Basophils Absolute 0.0  0.0 - 0.1 K/uL  COMPREHENSIVE METABOLIC PANEL      Result Value Ref Range   Sodium 142  137 - 147 mEq/L   Potassium 4.2  3.7 - 5.3 mEq/L   Chloride 104  96 - 112 mEq/L   CO2 26  19 - 32 mEq/L   Glucose, Bld 97  70 - 99 mg/dL   BUN 14  6 - 23 mg/dL   Creatinine, Ser 0.70  0.50 - 1.10 mg/dL   Calcium 9.6  8.4 - 10.5 mg/dL   Total Protein 7.5  6.0 - 8.3 g/dL   Albumin 3.6  3.5 - 5.2 g/dL   AST 26  0 - 37 U/L   ALT 17  0 - 35 U/L   Alkaline Phosphatase 112  39 - 117 U/L   Total Bilirubin <0.2 (*) 0.3 - 1.2 mg/dL   GFR calc non Af Amer >90  >90 mL/min   GFR calc Af Amer >90  >90 mL/min  LIPASE, BLOOD      Result Value Ref Range   Lipase 16  11 - 59 U/L   Ct  Abdomen Pelvis W Contrast  02/25/2014   CLINICAL DATA:  Right lower quadrant pain common nausea  EXAM: CT ABDOMEN AND PELVIS WITH CONTRAST  TECHNIQUE: Multidetector CT imaging of the abdomen and pelvis was performed using the standard protocol following bolus administration of intravenous contrast.  CONTRAST:  40mL OMNIPAQUE IOHEXOL 300 MG/ML SOLN, 165mL OMNIPAQUE IOHEXOL 300 MG/ML SOLN  COMPARISON:  Prior CT abdomen/pelvis 04/20/2011  FINDINGS: Lower Chest: The lung bases are clear. Visualized cardiac structures are within normal limits for size. No pericardial effusion. Unremarkable visualized distal thoracic esophagus.  Abdomen:  Unremarkable CT appearance of the stomach, duodenum, spleen and adrenal glands. No pancreatic mass or inflammation. Pancreas divisum anatomy. Normal hepatic contour and morphology. No suspicious hepatic lesion. Stable 5 mm hypo attenuating focus in segment 4 B consistent with a simple cyst. Hepatic and portal veins are patent. Gallbladder is unremarkable. No intra or extrahepatic biliary ductal dilatation. Unremarkable appearance of the bilateral kidneys. No focal solid lesion, hydronephrosis or nephrolithiasis.  Nonspecific stranding in the midline upper omental. Normal appendix in the right lower quadrant. No evidence of bowel obstruction or focal bowel wall thickening. No free fluid or suspicious adenopathy.  Pelvis: Surgical changes of prior hysterectomy. Unremarkable adnexae. Trace free fluid in the anatomic pelvis is likely physiologic. Unremarkable appearance of the bladder.  Bones/Soft Tissues: No acute fracture or aggressive appearing lytic or blastic osseous lesion. L5-S1 facet arthropathy.  Vascular: No significant atherosclerotic vascular disease, aneurysmal dilatation or acute abnormality.  IMPRESSION: 1. Nonspecific stranding in the midline upper omentum. The primary differential consideration is fat necrosis or panniculitis. 2. Pancreas divisum anatomy incidentally noted.  3. Surgical changes of prior hysterectomy.   Electronically Signed   By: Jacqulynn Cadet M.D.   On: 02/25/2014 22:27        Patient presents with periumbilical pain. Her CT shows a normal appendix. There is a question of some panniculitis. She does have some tenderness on exam today. Umbilical area. However she sitting up laughing and nontoxic appearing. I feel that she can be discharged home. I encouraged her to follow with her primary care physician tomorrow for recheck of her abdomen. Advised to return here for symptoms worsen.  Malvin Johns, MD 02/25/14 [phone]

## 2014-02-25 NOTE — ED Notes (Signed)
Patient transported to CT 

## 2014-02-25 NOTE — ED Notes (Signed)
C/o RUQ pain x 4 days-nausea-denies v/d

## 2014-02-25 NOTE — ED Provider Notes (Signed)
Medical screening examination/treatment/procedure(s) were conducted as a shared visit with non-physician practitioner(s) and myself.  I personally evaluated the patient during the encounter.   EKG Interpretation None        Malvin Johns, MD 02/25/14 2333

## 2014-02-28 ENCOUNTER — Other Ambulatory Visit: Payer: Self-pay | Admitting: Family Medicine

## 2014-03-01 NOTE — Telephone Encounter (Signed)
Is this ok to refill?  

## 2014-03-01 NOTE — Telephone Encounter (Signed)
Okay to renew

## 2014-03-01 NOTE — Telephone Encounter (Signed)
Medication sent in. 

## 2014-04-13 ENCOUNTER — Other Ambulatory Visit: Payer: Self-pay | Admitting: Family Medicine

## 2014-04-14 NOTE — Telephone Encounter (Signed)
I called out Ambien to the patients pharmacy per Chana Bode PA-C. CLS

## 2014-04-14 NOTE — Telephone Encounter (Signed)
Is this okay to refill? Last OV for medications is 12/2013. CLS

## 2014-05-02 ENCOUNTER — Other Ambulatory Visit: Payer: Self-pay | Admitting: Family Medicine

## 2014-05-30 ENCOUNTER — Other Ambulatory Visit: Payer: Self-pay | Admitting: Family Medicine

## 2014-05-31 NOTE — Telephone Encounter (Signed)
IS THIS OKAY 

## 2014-05-31 NOTE — Telephone Encounter (Signed)
Okay to renew with 5 refills

## 2014-06-30 ENCOUNTER — Other Ambulatory Visit: Payer: Self-pay | Admitting: Family Medicine

## 2014-07-01 NOTE — Telephone Encounter (Signed)
IS THIS OKAY 

## 2014-08-01 ENCOUNTER — Other Ambulatory Visit: Payer: Self-pay | Admitting: Family Medicine

## 2014-08-05 ENCOUNTER — Encounter: Payer: BC Managed Care – PPO | Admitting: Family Medicine

## 2014-09-02 ENCOUNTER — Telehealth: Payer: Self-pay | Admitting: Internal Medicine

## 2014-09-02 ENCOUNTER — Other Ambulatory Visit: Payer: Self-pay | Admitting: Family Medicine

## 2014-09-02 MED ORDER — ZOLPIDEM TARTRATE 10 MG PO TABS: ORAL_TABLET | ORAL | Status: DC

## 2014-09-02 NOTE — Telephone Encounter (Signed)
Refill request for ambien 10mg  to [facility]

## 2014-09-02 NOTE — Telephone Encounter (Signed)
Called out #30 to [facility] at [address]. Pt states she wanted it called there instead. Pt was advised that before she can get anymore refills she will need a visit.

## 2014-09-02 NOTE — Telephone Encounter (Signed)
Since its not my patient, I can call out 30 days but not 90.  May need to see Dr. Redmond School in recheck since last visit 12/2103 and been to the ED in the meantime.     If ok, then call out 30 days supply.

## 2014-09-06 ENCOUNTER — Other Ambulatory Visit: Payer: Self-pay | Admitting: Family Medicine

## 2014-09-30 ENCOUNTER — Ambulatory Visit (INDEPENDENT_AMBULATORY_CARE_PROVIDER_SITE_OTHER): Payer: BC Managed Care – PPO | Admitting: Family Medicine

## 2014-09-30 ENCOUNTER — Encounter: Payer: Self-pay | Admitting: Family Medicine

## 2014-09-30 VITALS — BP 110/70 | HR 96 | Ht 61.5 in | Wt 178.0 lb

## 2014-09-30 DIAGNOSIS — R7302 Impaired glucose tolerance (oral): Secondary | ICD-10-CM

## 2014-09-30 DIAGNOSIS — E669 Obesity, unspecified: Secondary | ICD-10-CM

## 2014-09-30 DIAGNOSIS — Z1211 Encounter for screening for malignant neoplasm of colon: Secondary | ICD-10-CM

## 2014-09-30 DIAGNOSIS — G479 Sleep disorder, unspecified: Secondary | ICD-10-CM

## 2014-09-30 DIAGNOSIS — F901 Attention-deficit hyperactivity disorder, predominantly hyperactive type: Secondary | ICD-10-CM

## 2014-09-30 DIAGNOSIS — M797 Fibromyalgia: Secondary | ICD-10-CM

## 2014-09-30 MED ORDER — AMPHETAMINE-DEXTROAMPHETAMINE 30 MG PO TABS: 30.0000 mg | ORAL_TABLET | Freq: Two times a day (BID) | ORAL | Status: DC

## 2014-09-30 MED ORDER — ZOLPIDEM TARTRATE ER 12.5 MG PO TBCR: 12.5000 mg | EXTENDED_RELEASE_TABLET | Freq: Every evening | ORAL | Status: DC | PRN

## 2014-09-30 MED ORDER — CYCLOBENZAPRINE HCL 10 MG PO TABS: ORAL_TABLET | ORAL | Status: DC

## 2014-09-30 MED ORDER — MELOXICAM 7.5 MG PO TABS: ORAL_TABLET | ORAL | Status: DC

## 2014-09-30 NOTE — Progress Notes (Signed)
   Subjective:    Patient ID: [name], female    DOB: 09-30-1966, 48 y.o.   MRN: [id number]  HPI He is here for medication check. She does have underlying ADHD and responds quite well to Adderall twice per day. Head helps her stay focused. She has had no difficulty with withdrawal or eating while she's on the medication. She also has fibromyalgia and continues on present medications. She does note that Ambien is not allowing her to sleep through the night. She did recently see her gynecologist and that blood work was reviewed. It did show hemoglobin A1c of 6.0. She is now 48 and will need to be referred for colonoscopy.   Review of Systems     Objective:   Physical Exam Alert and in no distress otherwise not examined.       Assessment & Plan:  Fibromyalgia muscle pain - Plan: cyclobenzaprine (FLEXERIL) 10 MG tablet, meloxicam (MOBIC) 7.5 MG tablet, zolpidem (AMBIEN CR) 12.5 MG CR tablet  Attention-deficit hyperactivity disorder, predominantly hyperactive type - Plan: amphetamine-dextroamphetamine (ADDERALL) 30 MG tablet, amphetamine-dextroamphetamine (ADDERALL) 30 MG tablet, amphetamine-dextroamphetamine (ADDERALL) 30 MG tablet  Special screening for malignant neoplasms, colon - Plan: Ambulatory referral to Gastroenterology  Need for Tdap vaccination - Plan: Tdap vaccine greater than or equal to 7yo IM  Obesity (BMI 30.0-34.9)  Glucose intolerance (impaired glucose tolerance)  Sleep disturbance - Plan: zolpidem (AMBIEN CR) 12.5 MG CR tablet I will renew her medications, update her immunizations. Also discussed permanent lifestyle change in regard to diet and exercise to help reduce her risk of diabetes. Explained to her that she is now at risk for this. Recheck this in 6 months.

## 2014-09-30 NOTE — Patient Instructions (Signed)
Permanent lifestyle change . 150 minutes a week of something physical. Cut back on "white food".bread, rice, possibly, potatoes, sugar. You can measure your success with your weight but it should not be your goal. If you want to have a goal have a particular pants or dress size as your goal and then stated

## 2014-10-03 ENCOUNTER — Other Ambulatory Visit: Payer: Self-pay | Admitting: Family Medicine

## 2014-10-04 ENCOUNTER — Other Ambulatory Visit: Payer: Self-pay

## 2014-10-04 ENCOUNTER — Other Ambulatory Visit: Payer: Self-pay | Admitting: Family Medicine

## 2014-10-04 NOTE — Telephone Encounter (Signed)
Is this okay?

## 2014-10-07 ENCOUNTER — Encounter: Payer: Self-pay | Admitting: Family Medicine

## 2014-10-12 ENCOUNTER — Encounter: Payer: Self-pay | Admitting: Internal Medicine

## 2014-12-03 ENCOUNTER — Encounter: Payer: Self-pay | Admitting: Family Medicine

## 2014-12-03 ENCOUNTER — Ambulatory Visit (INDEPENDENT_AMBULATORY_CARE_PROVIDER_SITE_OTHER): Payer: BLUE CROSS/BLUE SHIELD | Admitting: Family Medicine

## 2014-12-03 VITALS — BP 132/84 | HR 104 | Wt 170.0 lb

## 2014-12-03 DIAGNOSIS — F410 Panic disorder [episodic paroxysmal anxiety] without agoraphobia: Secondary | ICD-10-CM

## 2014-12-03 DIAGNOSIS — M797 Fibromyalgia: Secondary | ICD-10-CM

## 2014-12-03 DIAGNOSIS — Z6379 Other stressful life events affecting family and household: Secondary | ICD-10-CM

## 2014-12-03 DIAGNOSIS — F901 Attention-deficit hyperactivity disorder, predominantly hyperactive type: Secondary | ICD-10-CM

## 2014-12-03 MED ORDER — ALPRAZOLAM 0.25 MG PO TABS: 0.2500 mg | ORAL_TABLET | Freq: Two times a day (BID) | ORAL | Status: DC | PRN

## 2014-12-03 MED ORDER — AMPHETAMINE-DEXTROAMPHETAMINE 30 MG PO TABS: 30.0000 mg | ORAL_TABLET | Freq: Two times a day (BID) | ORAL | Status: DC
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
## 2014-12-06 ENCOUNTER — Telehealth: Payer: Self-pay

## 2014-12-06 NOTE — Telephone Encounter (Signed)
Pt has appointment 12/08/13

## 2014-12-06 NOTE — Telephone Encounter (Signed)
Patient use to see rheum.Devishwar but she cant afford to see her anymore she states that you had told her she didn't need to see Deveshwar that you can handle her fibromyalgia  and she needs the FMLA because she is late sometimes in the mornings and in order to make up time she has to have FMLA

## 2014-12-08 ENCOUNTER — Ambulatory Visit (INDEPENDENT_AMBULATORY_CARE_PROVIDER_SITE_OTHER): Payer: BLUE CROSS/BLUE SHIELD | Admitting: Family Medicine

## 2014-12-08 DIAGNOSIS — M797 Fibromyalgia: Secondary | ICD-10-CM

## 2014-12-08 DIAGNOSIS — G43009 Migraine without aura, not intractable, without status migrainosus: Secondary | ICD-10-CM

## 2014-12-08 MED ORDER — AMITRIPTYLINE HCL 10 MG PO TABS: 10.0000 mg | ORAL_TABLET | Freq: Every day | ORAL | Status: DC

## 2014-12-08 NOTE — Patient Instructions (Signed)
Take the amitriptyline at bedtime but hold taking the Ambien. Call me next week. We can go a lot higher with this medicine. I 10 if it helps with the headaches and with the aches and pains

## 2014-12-08 NOTE — Progress Notes (Signed)
   Subjective:    Patient ID: [name], female    DOB: [birth year], 49 y.o.   MRN: [id number]  HPI She was here initially for consult concerning filling out an FMLA. She then went on to describe the fact that she has headaches as well as aches and pains that keep her from getting to work on time. She states that she has had migraines for long period of time and has [facility]. She was given occipital injections which didn't help. She also was given Topamax and Darvocet. The Topamax did cause CNS side effects. She states that she gets 4 headaches per month that interfere with work but as many as 12 per month normally. She describes them as having a heavy sensation in her head with nausea, photophobia and smell also making it worse. She does not describe unilateral throbbing or have an aura.   Review of Systems     Objective:   Physical Exam alert and in no distress with appropriate affect      Assessment & Plan:  Migraine without aura and without status migrainosus, not intractable  Fibromyalgia muscle pain  I will place her on hematuria to lean. I explained that this could help both with her fibromyalgia and with her migraines. I will start her on 10 mg. She is to call me next week. We'll hold off on filling out her FMLA until she is under much better control. If no improvement with present medication, might possibly refer to headache specialist for further intervention.

## 2014-12-10 ENCOUNTER — Encounter: Payer: BC Managed Care – PPO | Admitting: Internal Medicine

## 2014-12-22 ENCOUNTER — Ambulatory Visit (AMBULATORY_SURGERY_CENTER): Payer: Self-pay | Admitting: *Deleted

## 2014-12-22 VITALS — Ht 61.0 in | Wt 172.6 lb

## 2014-12-22 DIAGNOSIS — Z1211 Encounter for screening for malignant neoplasm of colon: Secondary | ICD-10-CM

## 2014-12-22 MED ORDER — MOVIPREP 100 G PO SOLR: 1.0000 | Freq: Once | ORAL | Status: DC

## 2014-12-22 NOTE — Progress Notes (Signed)
Denies allergies to eggs or soy products. Denies complications with sedation or anesthesia. Denies O2 use. Denies use of diet or weight loss medications.  Emmi instructions given for colonoscopy.  

## 2014-12-23 ENCOUNTER — Telehealth: Payer: Self-pay | Admitting: Internal Medicine

## 2014-12-23 DIAGNOSIS — G43009 Migraine without aura, not intractable, without status migrainosus: Secondary | ICD-10-CM

## 2014-12-23 NOTE — Telephone Encounter (Signed)
Patient wanted to know if you still want her to come in feb 3 please advise

## 2014-12-23 NOTE — Telephone Encounter (Signed)
Explain to her that the medication usually helps with migraine and with sleep and she's having an adverse response to that. Refer her to [facility] neurology

## 2014-12-23 NOTE — Telephone Encounter (Signed)
Pt called stating that the amitriptyline is giving her side effects. She is having really bad migraines, can't go to sleep when she wants to and when she does, its hard for her to wake up in the morning so she has been late everyday for work, she is having nightmares and crying, she has not been using her ambien to help her sleep any.  Pt is suppose to come in February 3th to talk to you about FMLA and this meds. But in the meantime what do you want her to do.

## 2014-12-24 NOTE — Telephone Encounter (Signed)
No need to do that since she didn't really respond to my medication. Let's see what neurology has to say first

## 2014-12-27 NOTE — Telephone Encounter (Signed)
i have sent the referral over to [facility] and they should contact patient. Pt was notified that she did not need to come in and that i would cancel her feb 3rd appt

## 2014-12-29 ENCOUNTER — Institutional Professional Consult (permissible substitution): Payer: BLUE CROSS/BLUE SHIELD | Admitting: Family Medicine

## 2014-12-29 ENCOUNTER — Encounter: Payer: Self-pay | Admitting: Internal Medicine

## 2014-12-29 ENCOUNTER — Ambulatory Visit (AMBULATORY_SURGERY_CENTER): Payer: BLUE CROSS/BLUE SHIELD | Admitting: Internal Medicine

## 2014-12-29 VITALS — BP 108/70 | HR 80 | Temp 98.5°F | Resp 18 | Ht 61.0 in | Wt 170.0 lb

## 2014-12-29 DIAGNOSIS — Z1211 Encounter for screening for malignant neoplasm of colon: Secondary | ICD-10-CM

## 2014-12-29 MED ORDER — FLEET ENEMA 7-19 GM/118ML RE ENEM
1.0000 | ENEMA | Freq: Once | RECTAL | Status: AC
  Administered 2014-12-29: 1 via RECTAL

## 2014-12-29 MED ORDER — SODIUM CHLORIDE 0.9 % IV SOLN: 500.0000 mL | INTRAVENOUS | Status: DC

## 2014-12-29 NOTE — Op Note (Signed)
[place]  [address] [place], [id number]   COLONOSCOPY PROCEDURE REPORT  PATIENT: Tonya, Potts  MR#: [id number] BIRTHDATE: 19-Jun-1966 , 48  yrs. old GENDER: female ENDOSCOPIST: Lafayette Dragon, MD REFERRED CB:[facility] Redmond School, M.D. PROCEDURE DATE:  12/29/2014 PROCEDURE:   Colonoscopy, screening First Screening Colonoscopy - Avg.  risk and is 50 yrs.  old or older Yes.  Prior Negative Screening - Now for repeat screening. N/A  History of Adenoma - Now for follow-up colonoscopy & has been > or = to 3 yrs.  N/A  Polyps Removed Today? No.  Polyps Removed Today? No.  Recommend repeat exam, <10 yrs? Polyps Removed Today? No.  Recommend repeat exam, <10 yrs? No. ASA CLASS:   Class I INDICATIONS:average risk for colon cancer. MEDICATIONS: Monitored anesthesia care and Propofol 300 mg IV  DESCRIPTION OF PROCEDURE:   After the risks benefits and alternatives of the procedure were thoroughly explained, informed consent was obtained.  The digital rectal exam revealed no abnormalities of the rectum.   The LB [id number]  endoscope was introduced through the anus and advanced to the cecum, which was identified by both the appendix and ileocecal valve. No adverse events experienced.   The quality of the prep was Moviprep fair The instrument was then slowly withdrawn as the colon was fully examined.      COLON FINDINGS: A normal appearing cecum, ileocecal valve, and appendiceal orifice were identified.  The ascending, transverse, descending, sigmoid colon, and rectum appeared unremarkable. Retroflexed views revealed no abnormalities. The time to cecum=8 minutes 37 seconds.  Withdrawal time=6 minutes 03 seconds.  The scope was withdrawn and the procedure completed. COMPLICATIONS: There were no immediate complications.  ENDOSCOPIC IMPRESSION: Normal colonoscopy  RECOMMENDATIONS: High-fiber diet Recall colonoscopy in 10 years  eSigned:  Lafayette Dragon, MD  12/29/2014 2:45 PM   cc:

## 2014-12-29 NOTE — Patient Instructions (Signed)

## 2014-12-29 NOTE — Progress Notes (Signed)
  [place] Anesthesia Post-op Note  Patient: [name]  Procedure(s) Performed: colonoscopy  Patient Location: [facility] - Recovery Area  Anesthesia Type: Deep Sedation/Propofol  Level of Consciousness: awake, oriented and patient cooperative  Airway and Oxygen Therapy: Patient Spontanous Breathing  Post-op Pain: none  Post-op Assessment:  Post-op Vital signs reviewed, Patient's Cardiovascular Status Stable, Respiratory Function Stable, Patent Airway, No signs of Nausea or vomiting and Pain level controlled  Post-op Vital Signs: Reviewed and stable  Complications: No apparent anesthesia complications  Jamarion [place] E 2:46 PM

## 2014-12-29 NOTE — Progress Notes (Signed)
Pt denies any allergies to eggs or soy.

## 2014-12-30 ENCOUNTER — Telehealth: Payer: Self-pay | Admitting: *Deleted

## 2014-12-30 NOTE — Telephone Encounter (Signed)
  Follow up Call-  Call back number 12/29/2014  Post procedure Call Back phone  # [phone]  Permission to leave phone message Yes     Patient questions:  Busy signal x 2.

## 2015-01-04 ENCOUNTER — Ambulatory Visit: Payer: BLUE CROSS/BLUE SHIELD | Admitting: Family Medicine

## 2015-01-18 ENCOUNTER — Ambulatory Visit (INDEPENDENT_AMBULATORY_CARE_PROVIDER_SITE_OTHER): Payer: BLUE CROSS/BLUE SHIELD | Admitting: Family Medicine

## 2015-01-18 ENCOUNTER — Encounter: Payer: Self-pay | Admitting: Family Medicine

## 2015-01-18 VITALS — BP 110/78 | HR 106 | Wt 172.0 lb

## 2015-01-18 DIAGNOSIS — R319 Hematuria, unspecified: Secondary | ICD-10-CM

## 2015-01-18 DIAGNOSIS — G43009 Migraine without aura, not intractable, without status migrainosus: Secondary | ICD-10-CM

## 2015-01-18 DIAGNOSIS — N390 Urinary tract infection, site not specified: Secondary | ICD-10-CM

## 2015-01-18 LAB — POCT URINALYSIS DIPSTICK
Bilirubin, UA: NEGATIVE
Blood, UA: 10
Glucose, UA: NEGATIVE
Ketones, UA: NEGATIVE
Nitrite, UA: NEGATIVE
Protein, UA: 0.15
Spec Grav, UA: 1.025
Urobilinogen, UA: NEGATIVE
pH, UA: 6

## 2015-01-18 MED ORDER — SUMATRIPTAN SUCCINATE 100 MG PO TABS: 100.0000 mg | ORAL_TABLET | ORAL | Status: DC | PRN

## 2015-01-18 MED ORDER — CIPROFLOXACIN HCL 500 MG PO TABS: 500.0000 mg | ORAL_TABLET | Freq: Two times a day (BID) | ORAL | Status: DC

## 2015-01-18 MED ORDER — NORTRIPTYLINE HCL 10 MG PO CAPS: 10.0000 mg | ORAL_CAPSULE | Freq: Every day | ORAL | Status: DC

## 2015-01-18 NOTE — Progress Notes (Signed)
   Subjective:    Patient ID: [name], female    DOB: Oct 24, 1966, 49 y.o.   MRN: [id number]  HPI She states that 2 weeks ago she noted some blood in her urine. She was then placed on an antibiotic by her dentist in the urine cleared. She mainly complains of urgency but states that this has been going on much longer than that and has seen her gynecologist in the past for this. She also states that her headaches have continued and she has been late for work on several occasions. She states that the amitriptyline actually made her headaches worse. Apparently she has not been tried on a triptan. She is continuing to take Ambien to help with her sleep. She apparently has an appointment to see neurology March 21. She is concerned about her job due to being late on several occasions to get to work. She does work from home.   Review of Systems     Objective:   Physical Exam And in no distress otherwise not examined. Urine microscopic was positive for WBCs-TNTC       Assessment & Plan:  Hematuria - Plan: POCT Urinalysis Dipstick, ciprofloxacin (CIPRO) 500 MG tablet  Acute UTI - Plan: ciprofloxacin (CIPRO) 500 MG tablet  Migraine without aura and without status migrainosus, not intractable - Plan: SUMAtriptan (IMITREX) 100 MG tablet, nortriptyline (PAMELOR) 10 MG capsule  discussed various options with her since the amitriptyline did not work. She has tried other medications in the past. I explained that nortriptyline might possibly help but is not as good sedating as amitriptyline. We will try this and see if it works. I will also give her Imitrex to help with the headache and give her an FMLA form for the next 6 months.

## 2015-01-20 ENCOUNTER — Telehealth: Payer: Self-pay | Admitting: Family Medicine

## 2015-01-20 NOTE — Telephone Encounter (Signed)
lm

## 2015-02-11 ENCOUNTER — Other Ambulatory Visit: Payer: Self-pay | Admitting: Family Medicine

## 2015-02-11 NOTE — Telephone Encounter (Signed)
Is this okay to refill? 

## 2015-02-14 ENCOUNTER — Ambulatory Visit (INDEPENDENT_AMBULATORY_CARE_PROVIDER_SITE_OTHER): Payer: BLUE CROSS/BLUE SHIELD | Admitting: Neurology

## 2015-02-14 ENCOUNTER — Encounter: Payer: Self-pay | Admitting: Neurology

## 2015-02-14 VITALS — BP 104/80 | HR 110 | Temp 97.6°F | Resp 18 | Ht 61.0 in | Wt 173.6 lb

## 2015-02-14 DIAGNOSIS — Z658 Other specified problems related to psychosocial circumstances: Secondary | ICD-10-CM

## 2015-02-14 DIAGNOSIS — G4441 Drug-induced headache, not elsewhere classified, intractable: Secondary | ICD-10-CM

## 2015-02-14 DIAGNOSIS — M797 Fibromyalgia: Secondary | ICD-10-CM

## 2015-02-14 DIAGNOSIS — G4440 Drug-induced headache, not elsewhere classified, not intractable: Secondary | ICD-10-CM

## 2015-02-14 DIAGNOSIS — M542 Cervicalgia: Secondary | ICD-10-CM

## 2015-02-14 DIAGNOSIS — F439 Reaction to severe stress, unspecified: Secondary | ICD-10-CM

## 2015-02-14 DIAGNOSIS — G43709 Chronic migraine without aura, not intractable, without status migrainosus: Secondary | ICD-10-CM

## 2015-02-14 MED ORDER — TIZANIDINE HCL 2 MG PO TABS: 2.0000 mg | ORAL_TABLET | Freq: Three times a day (TID) | ORAL | Status: DC | PRN

## 2015-02-14 NOTE — Progress Notes (Signed)
NEUROLOGY CONSULTATION NOTE  [name] MRN: [id number] DOB: 09-Mar-1966  Referring provider: Dr. Redmond School Primary care provider: Dr. Redmond School  Reason for consult:  headache  HISTORY OF PRESENT ILLNESS: [name] is a 49 year old right-handed woman with fibromyalgia and ADHD who presents for headache.  Records reviewed.  Onset:  2007-08 Location:  Usually starts in shoulders and travels up neck to back of head and to the front of head Quality:  Pressure, throbbing Intensity:  6/10 Aura:  no Prodrome:  no Associated symptoms:  Photophobia, phonophobia, osmophobia.  Sometimes nausea and vomiting.  No visual disturbance Duration:  3 hours to all day.  Sometimes wakes up with it. Frequency:  21 headache days per month (12-15 migraine headaches per month) Triggers/exacerbating factors:  Stress, light Relieving factors:  none Activity:  Has to leave work or come in late about 2-3 times per month  Past abortive therapy:  Ibuprofen, naproxen, Excedrin, acetaminophen Past preventative therapy:  amitriptyline (made headaches worse), topiramate (memory problems) Other past medication:  Effexor (for fibromyalgia)  Current abortive therapy:  sumatriptan 100mg  Current preventative therapy:  nortriptyline 10mg  (started 2 weeks ago) Other medication:  sertraline 100mg , Adderall, takes tramadol 100mg  daily for fibromyalgia, Flexeril (ineffective)  Caffeine:  coffee Alcohol:  occasionally Smoker:  no Diet:  Good.  Keeps hydrated Exercise:  Walks daily Depression/stress:  Stress  Sleep hygiene:  No sleep well.  Takes Ambien Family history of headache:  Aunt has migraine.  Mom has history of headache related to uncontrolled blood pressure  PAST MEDICAL HISTORY: Past Medical History  Diagnosis Date  . Sleep disturbances   . History of migraine headaches   . Depression   . Anxiety   . Headache(784.0)   . Fibromyalgia     chronic fatigue, lower back pain,  .  Fibromyalgia   . Complication of anesthesia     pt states medications make her hyper instead of sleepy    PAST SURGICAL HISTORY: Past Surgical History  Procedure Laterality Date  . Tubal ligation    . Laparoscopic assisted vaginal hysterectomy N/A 01/16/2013    Procedure: LAPAROSCOPIC ASSISTED VAGINAL HYSTERECTOMY;  Surgeon: Marvene Staff, MD;  Location: [facility];  Service: Gynecology;  Laterality: N/A;  . Salpingoophorectomy Bilateral 01/16/2013    Procedure: SALPINGO OOPHORECTOMY;  Surgeon: Marvene Staff, MD;  Location: [facility];  Service: Gynecology;  Laterality: Bilateral;  . Anal fissure repair  [id number]    MEDICATIONS: Current Outpatient Prescriptions on File Prior to Visit  Medication Sig Dispense Refill  . ALPRAZolam (XANAX) 0.25 MG tablet TAKE ONE TABLET BY MOUTH TWICE DAILY AS NEEDED FOR ANXIETY 30 tablet 0  . [START ON 03/01/2015] amphetamine-dextroamphetamine (ADDERALL) 30 MG tablet Take 1 tablet by mouth 2 (two) times daily. 60 tablet 0  . amphetamine-dextroamphetamine (ADDERALL) 30 MG tablet Take 1 tablet by mouth 2 (two) times daily. 60 tablet 0  . amphetamine-dextroamphetamine (ADDERALL) 30 MG tablet Take 1 tablet by mouth 2 (two) times daily. 60 tablet 0  . cyclobenzaprine (FLEXERIL) 10 MG tablet TAKE ONE TABLET BY MOUTH ONCE DAILY 90 tablet 3  . meloxicam (MOBIC) 7.5 MG tablet TAKE ONE TABLET BY MOUTH ONCE DAILY 90 tablet 3  . nortriptyline (PAMELOR) 10 MG capsule Take 1 capsule (10 mg total) by mouth at bedtime. 30 capsule 5  . sertraline (ZOLOFT) 100 MG tablet TAKE ONE & ONE-HALF TABLETS BY MOUTH ONCE DAILY 45 tablet 0  . SUMAtriptan (IMITREX) 100 MG tablet Take 1 tablet (100  mg total) by mouth every 2 (two) hours as needed for migraine or headache. May repeat in 2 hours if headache persists or recurs. 10 tablet 0  . traMADol (ULTRAM) 50 MG tablet TAKE ONE TABLET BY MOUTH EVERY 6 HOURS AS NEEDED FOR PAIN 60 tablet 0  . valACYclovir (VALTREX) 1000 MG tablet Take  1,000 mg by mouth 2 (two) times daily.    Marland Kitchen zolpidem (AMBIEN CR) 12.5 MG CR tablet Take 1 tablet (12.5 mg total) by mouth at bedtime as needed for sleep. 90 tablet 3   No current facility-administered medications on file prior to visit.    ALLERGIES: Allergies  Allergen Reactions  . Codeine Itching  . Sulfur Itching    FAMILY HISTORY: Family History  Problem Relation Age of Onset  . Cancer Paternal Aunt   . Cancer Maternal Grandmother   . Colon cancer Neg Hx   . Esophageal cancer Neg Hx   . Rectal cancer Neg Hx   . Stomach cancer Neg Hx   . Bipolar disorder Sister   . Stroke Paternal Grandfather   . Thyroid disease Paternal Grandmother   . Cancer Paternal Uncle     SOCIAL HISTORY: History   Social History  . Marital Status: Divorced    Spouse Name: N/A  . Number of Children: N/A  . Years of Education: N/A   Occupational History  . Not on file.   Social History Main Topics  . Smoking status: Never Smoker   . Smokeless tobacco: Never Used  . Alcohol Use: 0.0 oz/week    0 Standard drinks or equivalent per week     Comment: 4 drinks per weekend.  . Drug Use: No  . Sexual Activity:    Partners: Male   Other Topics Concern  . Not on file   Social History Narrative    REVIEW OF SYSTEMS: Constitutional: No fevers, chills, or sweats, no generalized fatigue, change in appetite Eyes: No visual changes, double vision, eye pain Ear, nose and throat: No hearing loss, ear pain, nasal congestion, sore throat Cardiovascular: No chest pain, palpitations Respiratory:  No shortness of breath at rest or with exertion, wheezes GastrointestinaI: No nausea, vomiting, diarrhea, abdominal pain, fecal incontinence Genitourinary:  No dysuria, urinary retention or frequency Musculoskeletal:  Neck pain, back pain Integumentary: No rash, pruritus, skin lesions Neurological: as above Psychiatric: Stress, insomnia, anxiety Endocrine: No palpitations, fatigue, diaphoresis, mood  swings, change in appetite, change in weight, increased thirst Hematologic/Lymphatic:  No anemia, purpura, petechiae. Allergic/Immunologic: no itchy/runny eyes, nasal congestion, recent allergic reactions, rashes  PHYSICAL EXAM: Filed Vitals:   02/14/15 1011  BP: 104/80  Pulse: 110  Temp: 97.6 F (36.4 C)  Resp: 18   General: No acute distress Head:  Normocephalic/atraumatic Eyes:  fundi unremarkable, without vessel changes, exudates, hemorrhages or papilledema. Neck: supple, no paraspinal tenderness, full range of motion Back: No paraspinal tenderness Heart: regular rate and rhythm Lungs: Clear to auscultation bilaterally. Vascular: No carotid bruits. Neurological Exam: Mental status: alert and oriented to person, place, and time, recent and remote memory intact, fund of knowledge intact, attention and concentration intact, speech fluent and not dysarthric, language intact. Cranial nerves: CN I: not tested CN II: pupils equal, round and reactive to light, visual fields intact, fundi unremarkable, without vessel changes, exudates, hemorrhages or papilledema. CN III, IV, VI:  full range of motion, no nystagmus, no ptosis CN V: facial sensation intact CN VII: upper and lower face symmetric CN VIII: hearing intact CN IX, X:  gag intact, uvula midline CN XI: sternocleidomastoid and trapezius muscles intact CN XII: tongue midline Bulk & Tone: normal, no fasciculations. Motor:  5/5 throughout Sensation:  Temperature and vibration intact Deep Tendon Reflexes:  2+ throughout, toes downgoing Finger to nose testing:  No dysmetria Heel to shin:  No dysmetria Gait:  Normal station and stride.  Able to turn and walk in tandem. Romberg negative.  IMPRESSION: Chronic migraine without aura.  She has had these headaches for several years.  With normal exam, imaging is not warranted as they clinically appear to be migraine. Neck pain Medication-overuse related to daily use of tramadol for  fibromyalgia Fibromyalgia Stress  PLAN: 1.  Continue nortriptyline 10mg .  Call in 2 weeks with update and we can increase dose if needed. 2.  Sumatriptan 100mg  for abortive therapy.  Take with naproxen 500mg  if wakes up with migraine in morning. 3.  Advised to limit tramadol if she can due to rebound headache.  Also discussed risk of serotonin syndrome and seizure with tramadol and nortriptyline 4.  Sleep hygiene discussed 5.  Refer to cognitive behavioral therapy to help with stress 6.  Tizanidine for neck pain.  Stop Flexeril.  45 minutes spent with patient, over 50% spent discussing diagnosis and management.  Thank you for allowing me to take part in the care of this patient.  Metta Clines, DO  CC:  Jill Alexanders, MD

## 2015-02-14 NOTE — Patient Instructions (Signed)
1.  Continue nortriptyline 10mg  at bedtime for two more weeks and then call with update.  We can adjust dose if needed. 2.  At earliest onset of headache, take sumatriptan 100mg .  May repeat once in 2 hours if needed.  Do not exceed two tablets in 24 hours.  If you wake up with migraine, take sumatriptan with naproxen 500mg . 3.  Take tizanidine 2mg  for neck pain as needed.  May take up to three times a day as needed. 4.  Follow sleep hygiene sheet 5.  Refer for cognitive behavioral therapy to help with stress 6.  Stop caffeine 7.  Follow up in 3 months.

## 2015-02-16 ENCOUNTER — Other Ambulatory Visit: Payer: Self-pay | Admitting: Family Medicine

## 2015-02-16 NOTE — Telephone Encounter (Signed)
Okay to renew

## 2015-02-16 NOTE — Telephone Encounter (Signed)
Is this okay?

## 2015-03-06 ENCOUNTER — Other Ambulatory Visit: Payer: Self-pay | Admitting: Family Medicine

## 2015-03-07 ENCOUNTER — Telehealth: Payer: Self-pay | Admitting: Internal Medicine

## 2015-03-07 ENCOUNTER — Other Ambulatory Visit: Payer: Self-pay

## 2015-03-07 MED ORDER — TRAMADOL HCL 50 MG PO TABS: 50.0000 mg | ORAL_TABLET | Freq: Four times a day (QID) | ORAL | Status: DC | PRN

## 2015-03-07 NOTE — Telephone Encounter (Signed)
Is this okay?

## 2015-03-07 NOTE — Telephone Encounter (Signed)
Okay to renew
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
## 2015-03-07 NOTE — Telephone Encounter (Signed)
Give her the tramadol

## 2015-03-16 ENCOUNTER — Other Ambulatory Visit: Payer: Self-pay | Admitting: Family Medicine

## 2015-03-16 ENCOUNTER — Other Ambulatory Visit: Payer: Self-pay | Admitting: Neurology

## 2015-03-16 NOTE — Telephone Encounter (Signed)
Is this okay to refill? 

## 2015-05-16 ENCOUNTER — Other Ambulatory Visit: Payer: Self-pay | Admitting: Family Medicine

## 2015-05-17 ENCOUNTER — Other Ambulatory Visit: Payer: Self-pay

## 2015-05-17 NOTE — Telephone Encounter (Signed)
Is this okay?

## 2015-05-23 ENCOUNTER — Ambulatory Visit: Payer: BLUE CROSS/BLUE SHIELD | Admitting: Neurology

## 2015-06-14 ENCOUNTER — Other Ambulatory Visit: Payer: Self-pay

## 2015-06-14 ENCOUNTER — Other Ambulatory Visit: Payer: Self-pay | Admitting: Family Medicine

## 2015-06-14 NOTE — Telephone Encounter (Signed)
Is this okay?

## 2015-06-14 NOTE — Telephone Encounter (Signed)
Okay to renew

## 2015-07-19 ENCOUNTER — Ambulatory Visit (INDEPENDENT_AMBULATORY_CARE_PROVIDER_SITE_OTHER): Payer: BLUE CROSS/BLUE SHIELD | Admitting: Family Medicine

## 2015-07-19 ENCOUNTER — Other Ambulatory Visit: Payer: Self-pay

## 2015-07-19 ENCOUNTER — Encounter: Payer: Self-pay | Admitting: Family Medicine

## 2015-07-19 VITALS — BP 110/80 | HR 86 | Ht 61.5 in | Wt 175.0 lb

## 2015-07-19 DIAGNOSIS — F901 Attention-deficit hyperactivity disorder, predominantly hyperactive type: Secondary | ICD-10-CM | POA: Diagnosis not present

## 2015-07-19 DIAGNOSIS — F410 Panic disorder [episodic paroxysmal anxiety] without agoraphobia: Secondary | ICD-10-CM

## 2015-07-19 DIAGNOSIS — G43009 Migraine without aura, not intractable, without status migrainosus: Secondary | ICD-10-CM

## 2015-07-19 DIAGNOSIS — M797 Fibromyalgia: Secondary | ICD-10-CM

## 2015-07-19 DIAGNOSIS — Z0000 Encounter for general adult medical examination without abnormal findings: Secondary | ICD-10-CM | POA: Diagnosis not present

## 2015-07-19 LAB — CBC WITH DIFFERENTIAL/PLATELET
Basophils Absolute: 0 10*3/uL (ref 0.0–0.1)
Basophils Relative: 0 % (ref 0–1)
Eosinophils Absolute: 0.1 10*3/uL (ref 0.0–0.7)
Eosinophils Relative: 3 % (ref 0–5)
HCT: 37 % (ref 36.0–46.0)
Hemoglobin: 12.1 g/dL (ref 12.0–15.0)
Lymphocytes Relative: 44 % (ref 12–46)
Lymphs Abs: 2 10*3/uL (ref 0.7–4.0)
MCH: 27.1 pg (ref 26.0–34.0)
MCHC: 32.7 g/dL (ref 30.0–36.0)
MCV: 83 fL (ref 78.0–100.0)
MPV: 10.2 fL (ref 8.6–12.4)
Monocytes Absolute: 0.3 10*3/uL (ref 0.1–1.0)
Monocytes Relative: 7 % (ref 3–12)
Neutro Abs: 2.1 10*3/uL (ref 1.7–7.7)
Neutrophils Relative %: 46 % (ref 43–77)
Platelets: 239 10*3/uL (ref 150–400)
RBC: 4.46 MIL/uL (ref 3.87–5.11)
RDW: 14.5 % (ref 11.5–15.5)
WBC: 4.5 10*3/uL (ref 4.0–10.5)

## 2015-07-19 LAB — COMPREHENSIVE METABOLIC PANEL
ALT: 15 U/L (ref 6–29)
AST: 24 U/L (ref 10–35)
Albumin: 4 g/dL (ref 3.6–5.1)
Alkaline Phosphatase: 121 U/L (ref 33–130)
BUN: 12 mg/dL (ref 7–25)
CO2: 26 mmol/L (ref 20–31)
Calcium: 9.4 mg/dL (ref 8.6–10.4)
Chloride: 103 mmol/L (ref 98–110)
Creat: 0.59 mg/dL (ref 0.50–1.05)
Glucose, Bld: 91 mg/dL (ref 65–99)
Potassium: 4.1 mmol/L (ref 3.5–5.3)
Sodium: 138 mmol/L (ref 135–146)
Total Bilirubin: 0.3 mg/dL (ref 0.2–1.2)
Total Protein: 7 g/dL (ref 6.1–8.1)

## 2015-07-19 LAB — LIPID PANEL
Cholesterol: 243 mg/dL — ABNORMAL HIGH (ref 125–200)
HDL: 70 mg/dL (ref >=46)
LDL Cholesterol: 159 mg/dL — ABNORMAL HIGH (ref <130)
Total CHOL/HDL Ratio: 3.5 Ratio (ref <=5.0)
Triglycerides: 72 mg/dL (ref <150)
VLDL: 14 mg/dL (ref <30)

## 2015-07-19 LAB — POCT URINALYSIS DIPSTICK
Bilirubin, UA: NEGATIVE
Blood, UA: NEGATIVE
Glucose, UA: NEGATIVE
Ketones, UA: NEGATIVE
Leukocytes, UA: NEGATIVE
Nitrite, UA: NEGATIVE
Protein, UA: NEGATIVE
Spec Grav, UA: 1.02
Urobilinogen, UA: NEGATIVE
pH, UA: 6.5

## 2015-07-19 MED ORDER — AMPHETAMINE-DEXTROAMPHETAMINE 30 MG PO TABS: 30.0000 mg | ORAL_TABLET | Freq: Two times a day (BID) | ORAL | Status: DC

## 2015-07-19 MED ORDER — SERTRALINE HCL 100 MG PO TABS: 100.0000 mg | ORAL_TABLET | Freq: Two times a day (BID) | ORAL | Status: DC

## 2015-07-19 MED ORDER — TRAMADOL HCL 50 MG PO TABS: 50.0000 mg | ORAL_TABLET | Freq: Four times a day (QID) | ORAL | Status: DC | PRN

## 2015-07-19 MED ORDER — SUMATRIPTAN SUCCINATE 100 MG PO TABS: 100.0000 mg | ORAL_TABLET | ORAL | Status: DC | PRN

## 2015-07-19 MED ORDER — TIZANIDINE HCL 2 MG PO TABS: ORAL_TABLET | ORAL | Status: DC

## 2015-07-19 MED ORDER — MELOXICAM 7.5 MG PO TABS: ORAL_TABLET | ORAL | Status: DC

## 2015-07-19 NOTE — Patient Instructions (Signed)
The Imitrex when you see the lights flashing or you get dizzy do not wait. You can take another Imitrex 2 hours later
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
## 2015-08-18 ENCOUNTER — Ambulatory Visit: Payer: BLUE CROSS/BLUE SHIELD | Admitting: Family Medicine

## 2015-08-28 ENCOUNTER — Other Ambulatory Visit: Payer: Self-pay | Admitting: Family Medicine

## 2015-08-29 ENCOUNTER — Other Ambulatory Visit: Payer: Self-pay

## 2015-08-29 NOTE — Telephone Encounter (Signed)
Is this okay?

## 2015-08-29 NOTE — Telephone Encounter (Signed)
Okay to renew

## 2015-11-14 ENCOUNTER — Other Ambulatory Visit: Payer: Self-pay | Admitting: Family Medicine

## 2015-11-14 NOTE — Telephone Encounter (Signed)
Are these okay to call in?

## 2015-11-14 NOTE — Telephone Encounter (Signed)
Okay to renew

## 2015-11-18 ENCOUNTER — Other Ambulatory Visit: Payer: Self-pay | Admitting: Family Medicine

## 2015-11-18 ENCOUNTER — Telehealth: Payer: Self-pay | Admitting: Family Medicine

## 2015-11-18 NOTE — Telephone Encounter (Signed)
Called in.

## 2015-11-18 NOTE — Telephone Encounter (Signed)
Refill request from [facility] [address] Last filled 09/28/15

## 2015-11-18 NOTE — Telephone Encounter (Signed)
Okay to renew

## 2015-11-18 NOTE — Telephone Encounter (Signed)
Ok to refill 

## 2015-11-18 NOTE — Telephone Encounter (Signed)
This should already be in your basket to be handled.

## 2015-11-22 NOTE — Telephone Encounter (Signed)
Amber already called in med

## 2016-01-28 ENCOUNTER — Other Ambulatory Visit: Payer: Self-pay | Admitting: Family Medicine

## 2016-01-30 ENCOUNTER — Telehealth: Payer: Self-pay | Admitting: Family Medicine

## 2016-01-30 ENCOUNTER — Other Ambulatory Visit: Payer: Self-pay | Admitting: Family Medicine

## 2016-01-30 NOTE — Telephone Encounter (Signed)
Dr Redmond School ok to refill xanax zanaflex zoloft?

## 2016-01-30 NOTE — Telephone Encounter (Signed)
Xanax was printed by mistake so this was called into pharmacy

## 2016-03-20 ENCOUNTER — Ambulatory Visit (INDEPENDENT_AMBULATORY_CARE_PROVIDER_SITE_OTHER): Payer: BLUE CROSS/BLUE SHIELD | Admitting: Family Medicine

## 2016-03-20 VITALS — BP 140/100 | HR 101 | Ht 61.5 in | Wt 176.8 lb

## 2016-03-20 DIAGNOSIS — G43009 Migraine without aura, not intractable, without status migrainosus: Secondary | ICD-10-CM

## 2016-03-20 DIAGNOSIS — F901 Attention-deficit hyperactivity disorder, predominantly hyperactive type: Secondary | ICD-10-CM

## 2016-03-20 DIAGNOSIS — M797 Fibromyalgia: Secondary | ICD-10-CM | POA: Diagnosis not present

## 2016-03-20 MED ORDER — AMPHETAMINE-DEXTROAMPHETAMINE 30 MG PO TABS: 30.0000 mg | ORAL_TABLET | Freq: Two times a day (BID) | ORAL | Status: DC

## 2016-03-20 NOTE — Progress Notes (Signed)
   Subjective:    Patient ID: [name], female    DOB: 02-26-1966, 50 y.o.   MRN: [id number]  HPI She is here to have an FMLA form filled out. She has had this filled out in the past which gives her time off from her fibromyalgia as well as migraines. She apparently has 3 migraines per week. She does use Imitrex for this. In the past she was given Pamelor but stopped taking the medication. When quizzed about this who thought it was for the fibromyalgia. She has been seen by neurology but did not like the encounter and therefore did not go back. She has not seen Dr. Bennie Dallas in quite some time. Has underlying ADD and would like a refill on her medications.   Review of Systems     Objective:   Physical Exam Alert and in no distress otherwise not examined       Assessment & Plan:  Attention-deficit hyperactivity disorder, predominantly hyperactive type  Migraine without aura and without status migrainosus, not intractable  Fibromyalgia muscle pain It is a somewhat confusing picture especially with having this many migraines. She was given Pamelor however did not continue on this. After discussion with her I will renew her Adderall but think that it would be appropriate to get follow-up with Dr. Bennie Dallas and possibly with neurology to help better control her fibromyalgia pain as well as deal with her migraines.

## 2016-04-10 ENCOUNTER — Other Ambulatory Visit: Payer: Self-pay | Admitting: Family Medicine

## 2016-04-10 ENCOUNTER — Other Ambulatory Visit: Payer: Self-pay

## 2016-04-10 NOTE — Telephone Encounter (Signed)
Called tramadol in

## 2016-04-10 NOTE — Telephone Encounter (Signed)
Is this okay to refill? 

## 2016-04-10 NOTE — Telephone Encounter (Signed)
Renew with 5 refills

## 2016-04-11 NOTE — Telephone Encounter (Signed)
Is this ok to refill?  

## 2016-04-12 NOTE — Telephone Encounter (Signed)
Phoned in.

## 2016-04-26 ENCOUNTER — Other Ambulatory Visit: Payer: Self-pay

## 2016-04-26 ENCOUNTER — Other Ambulatory Visit: Payer: Self-pay | Admitting: Family Medicine

## 2016-04-26 NOTE — Telephone Encounter (Signed)
Ok

## 2016-04-26 NOTE — Telephone Encounter (Signed)
Called Xanax in per JCl

## 2016-04-26 NOTE — Telephone Encounter (Signed)
Is this okay to refill? 

## 2016-06-05 ENCOUNTER — Other Ambulatory Visit: Payer: Self-pay

## 2016-06-05 ENCOUNTER — Other Ambulatory Visit: Payer: Self-pay | Admitting: Family Medicine

## 2016-06-05 NOTE — Telephone Encounter (Signed)
Is this okay to refill? 

## 2016-06-05 NOTE — Telephone Encounter (Signed)
Sure she is scheduled for a follow-up visit

## 2016-06-14 ENCOUNTER — Telehealth: Payer: Self-pay | Admitting: Family Medicine

## 2016-06-15 NOTE — Telephone Encounter (Signed)
P.A. Approved til 06/14/19

## 2016-06-20 NOTE — Telephone Encounter (Signed)
Left message for pt

## 2016-07-30 ENCOUNTER — Other Ambulatory Visit: Payer: Self-pay | Admitting: Family Medicine

## 2016-07-31 ENCOUNTER — Other Ambulatory Visit: Payer: Self-pay | Admitting: Family Medicine

## 2016-07-31 NOTE — Telephone Encounter (Signed)
Is this okay to refill? 

## 2016-07-31 NOTE — Telephone Encounter (Signed)
Okay to refill? 

## 2016-07-31 NOTE — Telephone Encounter (Signed)
Called in xanax per jcl 

## 2016-08-06 ENCOUNTER — Ambulatory Visit: Payer: Self-pay | Admitting: Family Medicine

## 2016-08-06 ENCOUNTER — Encounter: Payer: Self-pay | Admitting: Family Medicine

## 2016-08-06 ENCOUNTER — Other Ambulatory Visit: Payer: Self-pay

## 2016-08-06 ENCOUNTER — Ambulatory Visit (INDEPENDENT_AMBULATORY_CARE_PROVIDER_SITE_OTHER): Payer: BLUE CROSS/BLUE SHIELD | Admitting: Family Medicine

## 2016-08-06 VITALS — BP 114/80 | HR 86 | Ht 61.5 in | Wt 172.0 lb

## 2016-08-06 DIAGNOSIS — M797 Fibromyalgia: Secondary | ICD-10-CM | POA: Diagnosis not present

## 2016-08-06 DIAGNOSIS — Z23 Encounter for immunization: Secondary | ICD-10-CM

## 2016-08-06 DIAGNOSIS — F901 Attention-deficit hyperactivity disorder, predominantly hyperactive type: Secondary | ICD-10-CM

## 2016-08-06 DIAGNOSIS — Z9119 Patient's noncompliance with other medical treatment and regimen: Secondary | ICD-10-CM | POA: Diagnosis not present

## 2016-08-06 DIAGNOSIS — G43009 Migraine without aura, not intractable, without status migrainosus: Secondary | ICD-10-CM | POA: Diagnosis not present

## 2016-08-06 DIAGNOSIS — Z91199 Patient's noncompliance with other medical treatment and regimen due to unspecified reason: Secondary | ICD-10-CM

## 2016-08-06 DIAGNOSIS — G43719 Chronic migraine without aura, intractable, without status migrainosus: Secondary | ICD-10-CM

## 2016-08-06 MED ORDER — SUMATRIPTAN SUCCINATE 100 MG PO TABS: 100.0000 mg | ORAL_TABLET | ORAL | 1 refills | Status: DC | PRN

## 2016-08-06 MED ORDER — AMPHETAMINE-DEXTROAMPHETAMINE 30 MG PO TABS: 30.0000 mg | ORAL_TABLET | Freq: Two times a day (BID) | ORAL | 0 refills | Status: DC

## 2016-08-06 MED ORDER — SERTRALINE HCL 100 MG PO TABS: 100.0000 mg | ORAL_TABLET | Freq: Two times a day (BID) | ORAL | 5 refills | Status: DC

## 2016-08-06 MED ORDER — NORTRIPTYLINE HCL 10 MG PO CAPS: 10.0000 mg | ORAL_CAPSULE | Freq: Every day | ORAL | 1 refills | Status: DC

## 2016-08-06 NOTE — Patient Instructions (Signed)
Call me in 2 weeks and let me know how the nortriptyline is doing to help prevent migraine

## 2016-08-06 NOTE — Progress Notes (Signed)
   Subjective:    Patient ID: [name], female    DOB: 22-May-1966, 50 y.o.   MRN: [id number]  HPI She is here for a medication management visit. She came in one half hour late and had to be rescheduled later in the day. Since last being seen she was supposed to set up an appointment to see rheumatology as well as get in for neurology visit. She apparently did not have a good visit with the neurologist and wants to see someone different. She stated that no one called her from the neurology office and she got too busy to call herself. When I challenged her on that she said that her work was more important than her health. She is no longer taking nortriptyline given to her by the neurologist. She states that she gets usually 2 to3 headaches per week and does miss as many as 3-4 hours of work per episode. Winquist upon this it was difficult for her to explain a fibromyalgia headache from a migraine headache other than sometimes a migraine does courier she does not take care of the regular headache. She will use tramadol for this and Winquist about the sumatriptan she could not give a good answer. Review of the record indicates she has not gotten a refill on the sumatriptan since August of last year. She also continues on Zoloft and is asking for higher dosing her mood is not what it should be. She does have an FMLA due to the above issues. She apparently has had this for quite some time.   Review of Systems     Objective:   Physical Exam Alert and in no distress otherwise not examined       Assessment & Plan:  Need for prophylactic vaccination and inoculation against influenza - Plan: Flu Vaccine QUAD 36+ mos IM  Need for Tdap vaccination - Plan: Tdap vaccine greater than or equal to 7yo IM  Fibromyalgia muscle pain - Plan: sertraline (ZOLOFT) 100 MG tablet, nortriptyline (PAMELOR) 10 MG capsule  Attention-deficit hyperactivity disorder, predominantly hyperactive type - Plan:  amphetamine-dextroamphetamine (ADDERALL) 30 MG tablet, amphetamine-dextroamphetamine (ADDERALL) 30 MG tablet, amphetamine-dextroamphetamine (ADDERALL) 30 MG tablet  Migraine without aura and without status migrainosus, not intractable - Plan: SUMAtriptan (IMITREX) 100 MG tablet, nortriptyline (PAMELOR) 10 MG capsule  Personal history of noncompliance with medical treatment, presenting hazards to health I explained that she needs to take responsibility for her care and if  referral are made and she does not hear, she should call to get this squared away. I will also place her back on nortriptyline and she is to call me in 2 weeks. Discussed the use of tramadol and Imitrex. Encouraged her to use the Imitrex when she knows she definitely has a migraine. Also renew her Adderall. I will keep her on the same dose of Zoloft for the time being. I explained that I would like to eventually get her on medications for better control of her fibromyalgia and headaches such that she does not need the FMLA. I will wait to see if Dr. Estanislado Pandy or the neurologist changes in her medications. Recheck here in roughly 1 month.

## 2016-08-11 ENCOUNTER — Other Ambulatory Visit: Payer: Self-pay | Admitting: Family Medicine

## 2016-08-13 ENCOUNTER — Ambulatory Visit: Payer: BLUE CROSS/BLUE SHIELD | Admitting: Skilled Nursing Facility1

## 2016-08-13 NOTE — Telephone Encounter (Signed)
Please address for JCL pt.

## 2016-09-04 ENCOUNTER — Encounter: Payer: Self-pay | Admitting: Dietician

## 2016-09-04 ENCOUNTER — Encounter: Payer: BLUE CROSS/BLUE SHIELD | Attending: Obstetrics and Gynecology | Admitting: Dietician

## 2016-09-04 DIAGNOSIS — R7303 Prediabetes: Secondary | ICD-10-CM | POA: Diagnosis not present

## 2016-09-04 DIAGNOSIS — Z713 Dietary counseling and surveillance: Secondary | ICD-10-CM | POA: Diagnosis not present

## 2016-09-04 NOTE — Patient Instructions (Addendum)
Be mindful about your food choices.  Avoid grocery shopping when you are hungry. Eat slowly.  Stop when you are satisfied or full. Consider drinking more water rather than sweetened beverages. Find something that you enjoy for exercise.  Start slowly and increase to 30 minutes most days. What can you do rather than eat when you are stressed?  Walk, music, color, chat, go outside. If hungry for a snack have a small portion of protein or protein and around 15 grams of carbohydrates. Eat more non starchy vegetables. Consider decreasing your sugar/sweet intake.  Try Dionne Milo bar or other naturally sweet for craving to see if this helps without causing increased craving.

## 2016-09-04 NOTE — Progress Notes (Signed)
Medical Nutrition Therapy:  Appt start time: 0900 end time:  1045.   Assessment:  Primary concerns today: Patient is here alone.  She was referred for Prediabetes.  She states that she is a very picky eater and does not like many foods and her desire to eat food is minimal but craves desserts.  She states when she goes out for dinner she will often eat the dessert first and may or may not eat dinner.  Symptoms started 2 years ago after her PCP told her that she might have prediabetes.  "Then I just began craving sugar."   Increased stress eating daily.  Other hx includes vitamin D deficiency and Fibromyalgia.  Weight hx: Her weight today is 169 lbs which has decreased from 175 lbs 06/25/16.  She lost without trying. Highest adult weight 175 lbs Lowest adult weight 130 lbs 8 years ago. She gained weight at that time when she became unemployed and was under extreme stress.  Patient's son lives with her.  She "hates cooking" and eats out most of the time.  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  Preferred Learning Style:   Auditory  Visual  Hands on  No preference indicated   Learning Readiness:   Contemplating  MEDICATIONS: see list   DIETARY INTAKE:  Usual eating pattern includes 3 meals and 3-4 snacks per day. Everyday foods include increased candy and other sweets.     24-hr recall:  B (9-9:30 AM): 2 scrambled eggs with provolone, banana and [employer] double shot at work cafeteria Snk (11 AM): candy or ice cream from the concession stand  L (1:30-2:30 PM): Ham and cheese sandwich with mayo from [place] on sup roll. Snk ( PM): candy or ice cream from the concession stand D ( PM): skips rarely OR cracker barrel chicken and dumplins and turnip greens (eats 1/2 food generally) OR Shrimp lo mien OR grilled BBQ chicken, hamburger, salad OR fried pork chop and greens and mac and cheese at [place] ( PM): something chocolate (Special dark chocolate)  She also sleep eats and finds evidence in the morning.  This is just if there is something in the house that is sweet. Beverages: 1 soda per day, [employer] double shot daily, 48-64 ounces of water, rare almond milk  Usual physical activity: none  Estimated energy needs: 1400 calories 158 g carbohydrates 105 g protein 39 g fat  Progress Towards Goal(s):  In progress.   Nutritional Diagnosis:  NB-1.1 Food and nutrition-related knowledge deficit As related to balance of carbohydrates, protein, adn fat.  As evidenced by diet hx.    Intervention:  Nutrition counseling/education related to prediabetes and patient's desire to lose weight.  Discussed importance of stress control and avoiding stress eating.  Discussed healthier choices when eating out.  Discussed importance of increasing physical activity.    Be mindful about your food choices.  Avoid grocery shopping when you are hungry. Eat slowly.  Stop when you are satisfied or full. Consider drinking more water rather than sweetened beverages. Find something that you enjoy for exercise.  Start slowly and increase to 30 minutes most days. What can you do rather than eat when you are stressed?  Walk, music, color, chat, go outside. If hungry for a snack have a small portion of protein or protein and around 15 grams of carbohydrates. Eat more non starchy vegetables. Consider decreasing your sugar/sweet  intake.  Try Dionne Milo bar or other naturally sweet for craving to see if this helps without causing increased craving.  Teaching Method Utilized:  Visual Auditory Hands on  Handouts given during visit include:  Meal plan card  Label reading  A1C sheet  Snack list  My plate  Barriers to learning/adherence to lifestyle change: increased work stress  Demonstrated degree of understanding via:  Teach Back   Monitoring/Evaluation:  Dietary intake, exercise, label  reading, and body weight prn.

## 2016-09-13 ENCOUNTER — Ambulatory Visit (INDEPENDENT_AMBULATORY_CARE_PROVIDER_SITE_OTHER): Payer: BLUE CROSS/BLUE SHIELD | Admitting: Family Medicine

## 2016-09-13 ENCOUNTER — Telehealth: Payer: Self-pay | Admitting: Family Medicine

## 2016-09-13 ENCOUNTER — Encounter: Payer: Self-pay | Admitting: Family Medicine

## 2016-09-13 VITALS — BP 130/70 | HR 109 | Wt 173.0 lb

## 2016-09-13 DIAGNOSIS — G43009 Migraine without aura, not intractable, without status migrainosus: Secondary | ICD-10-CM

## 2016-09-13 DIAGNOSIS — M797 Fibromyalgia: Secondary | ICD-10-CM

## 2016-09-13 DIAGNOSIS — F410 Panic disorder [episodic paroxysmal anxiety] without agoraphobia: Secondary | ICD-10-CM

## 2016-09-13 DIAGNOSIS — Z91199 Patient's noncompliance with other medical treatment and regimen due to unspecified reason: Secondary | ICD-10-CM

## 2016-09-13 DIAGNOSIS — Z9119 Patient's noncompliance with other medical treatment and regimen: Secondary | ICD-10-CM | POA: Diagnosis not present

## 2016-09-13 MED ORDER — CYCLOBENZAPRINE HCL 5 MG PO TABS: 5.0000 mg | ORAL_TABLET | Freq: Three times a day (TID) | ORAL | 1 refills | Status: DC | PRN

## 2016-09-13 MED ORDER — NORTRIPTYLINE HCL 10 MG PO CAPS: 10.0000 mg | ORAL_CAPSULE | Freq: Every day | ORAL | 1 refills | Status: DC

## 2016-09-13 NOTE — Telephone Encounter (Signed)
Patient checked and she does not have nortrptyline  Needs rx sent to [facility]

## 2016-09-13 NOTE — Patient Instructions (Signed)
Check your medications and make sure you got the nortriptyline  taking  Use the Xanax during the day Stop the Zanaflex and I'll switch her back to Flexeril

## 2016-09-13 NOTE — Progress Notes (Signed)
   Subjective:    Patient ID: [name], female    DOB: Apr 16, 1966, 50 y.o.   MRN: [id number]  HPI She is here for a one-month follow-up. On her last visit, I placed her back on nortriptyline however she states today she is not sure whether she is on this. She apparently takes lots of medications and has not checked on this. She does state that the Imitrex is helping with her headaches making them less of a burden but still having the same number of headaches. She states that stress does make this worse. She also states that Zanaflex is not as useful as Flexeril. She does complain of stress and anxiety causing her aching and migraines to get worse. She does have Xanax which she is to be able to take twice per day but states that she is only mainly taking it at night. She is involved in counseling with Ancil Boozer [phone]). She did give me permission to talk to her therapist. She states that I was to increase her Zoloft however my notes states that I was going to keep it the same. She stated that she could not get an appointment with Dr. Bennie Dallas. Discussion with my CMA indicates that we did send information off to Dr. Wonda Cheng and have not heard back from this. She made no comment about the fact that my nurse indeed did talk to her about seeing a different rheumatologist   Review of Systems     Objective:   Physical Exam Alert and in no distress otherwise not examined       Assessment & Plan:  Fibromyalgia muscle pain  Migraine without aura and without status migrainosus, not intractable  Personal history of noncompliance with medical treatment, presenting hazards to health  Panic attacks I again stressed the fact that she needs to take more control over her health and well-being. Encouraged her to always bring in all of her medications. She will check her medications and see if she is taking nortriptyline and let me know. I explained that if she is indeed taking it, we will  increase it and if she has not, she is to start taking this. I will also switch her to Flexeril and stop the Zanaflex. I did have a brief discussion with Ancil Boozer this morning concerning my thoughts and ideas about her noncompliance . Did recommend that she can take the Xanax twice per day since she voiced concerns over stress and anxiety causing her to become forgetful. I explained that the medicine can be taken twice per day. Again my overall concern is she is given the multiple reasons all indicating that she is really not taking his much control over her health and well-being as she should. Greater than 2525 minutes spent in counseling and coordination of care.

## 2016-09-14 ENCOUNTER — Telehealth: Payer: Self-pay

## 2016-09-14 NOTE — Telephone Encounter (Signed)
Dr.Lalonde ,    Dr.Deveshwar, Dr.Truslow nor [facility] rheumatology are treating fibromyalgia any more so what do you want me to please advise.

## 2016-09-19 ENCOUNTER — Telehealth: Payer: Self-pay

## 2016-09-19 NOTE — Telephone Encounter (Signed)
I have mailed letter of  appointments

## 2016-09-19 NOTE — Telephone Encounter (Signed)
I have called pt mobile left message I have an appointment for her Fibromyalgia for her to please call me back

## 2016-09-20 ENCOUNTER — Telehealth: Payer: Self-pay | Admitting: Family Medicine

## 2016-09-20 NOTE — Telephone Encounter (Signed)
Pt called requesting to speak to Cheri when Carmel Sacramento returns to the office

## 2016-09-23 ENCOUNTER — Other Ambulatory Visit: Payer: Self-pay | Admitting: Family Medicine

## 2016-09-24 ENCOUNTER — Other Ambulatory Visit: Payer: Self-pay

## 2016-09-24 ENCOUNTER — Telehealth: Payer: Self-pay | Admitting: Family Medicine

## 2016-09-24 NOTE — Telephone Encounter (Signed)
Call them and let them know I'm aware of it. Call her and make sure that if she has any symptoms out of the ordinary, call us

## 2016-09-24 NOTE — Telephone Encounter (Signed)
Pt informed and verbalized understanding

## 2016-09-24 NOTE — Telephone Encounter (Signed)
[facility] [address] called to make sure that Dr Redmond School is aware of a drug interaction between Nortriptyline & Sertraline before they give the Nortriptyline to the pt

## 2016-09-24 NOTE — Telephone Encounter (Signed)
ok 

## 2016-09-24 NOTE — Telephone Encounter (Signed)
Is this okay to refill? 

## 2016-09-24 NOTE — Telephone Encounter (Signed)
Pt informed of her appointment at [facility]

## 2016-09-24 NOTE — Telephone Encounter (Signed)
Called in tramadol per jcl 

## 2016-10-02 ENCOUNTER — Telehealth: Payer: Self-pay

## 2016-10-02 ENCOUNTER — Other Ambulatory Visit: Payer: Self-pay

## 2016-10-02 MED ORDER — ALPRAZOLAM 0.25 MG PO TABS: 0.2500 mg | ORAL_TABLET | Freq: Two times a day (BID) | ORAL | 0 refills | Status: DC | PRN

## 2016-10-02 NOTE — Telephone Encounter (Signed)
Called in.

## 2016-10-02 NOTE — Telephone Encounter (Signed)
ok 

## 2016-10-02 NOTE — Telephone Encounter (Signed)
Fax rcvd for refill of xanax to [facility] on [address].

## 2016-10-09 ENCOUNTER — Ambulatory Visit (INDEPENDENT_AMBULATORY_CARE_PROVIDER_SITE_OTHER): Payer: BLUE CROSS/BLUE SHIELD | Admitting: Neurology

## 2016-10-09 ENCOUNTER — Encounter: Payer: Self-pay | Admitting: Neurology

## 2016-10-09 VITALS — BP 122/68 | HR 90 | Ht 61.5 in | Wt 173.0 lb

## 2016-10-09 DIAGNOSIS — M797 Fibromyalgia: Secondary | ICD-10-CM | POA: Diagnosis not present

## 2016-10-09 DIAGNOSIS — F419 Anxiety disorder, unspecified: Secondary | ICD-10-CM

## 2016-10-09 DIAGNOSIS — F418 Other specified anxiety disorders: Secondary | ICD-10-CM | POA: Diagnosis not present

## 2016-10-09 DIAGNOSIS — F32A Depression, unspecified: Secondary | ICD-10-CM

## 2016-10-09 DIAGNOSIS — G43709 Chronic migraine without aura, not intractable, without status migrainosus: Secondary | ICD-10-CM | POA: Diagnosis not present

## 2016-10-09 DIAGNOSIS — F329 Major depressive disorder, single episode, unspecified: Secondary | ICD-10-CM

## 2016-10-09 NOTE — Patient Instructions (Signed)
Migraine Recommendations: 1.  We will try to get authorization for Botox 2.  Take Imitrex at earliest onset of headache.  May repeat dose once in 2 hours if needed.  Do not exceed two tablets in 24 hours. 3.  Limit use of pain relievers to no more than 2 days out of the week.  These medications include acetaminophen, ibuprofen, triptans and narcotics.  This will help reduce risk of rebound headaches. 4.  Be aware of common food triggers such as processed sweets, processed foods with nitrites (such as deli meat, hot dogs, sausages), foods with MSG, alcohol (such as wine), chocolate, certain cheeses, certain fruits (dried fruits, some citrus fruit), vinegar, diet soda. 4.  Avoid caffeine 5.  Routine exercise 6.  Proper sleep hygiene 7.  Stay adequately hydrated with water 8.  Keep a headache diary. 9.  Maintain proper stress management. 10.  Do not skip meals. 11.  Consider supplements:  Magnesium citrate 400mg  to 600mg  daily, riboflavin 400mg , Coenzyme Q 10 100mg  three times daily   Recurrent Migraine Headache Migraines are a type of headache, and they are usually stronger and more sudden than normal headaches (tension headaches). Migraines are characterized by an intense pulsing, throbbing pain that is usually only present on one side of the head. Sometimes, migraine headaches can cause nausea, vomiting, sensitivity to light and sound, and vision changes. Recurrent migraines keep coming back (recurring). A migraine can last from 4 hours up to 3 days. What are the causes? The exact cause of this condition is not known. However, a migraine may be caused when nerves in the brain become irritated and release chemicals that cause inflammation of blood vessels. This inflammation causes pain. Certain things may also trigger migraines, such as:  A disruption in your regular eating and sleeping schedule.  Smoking.  Stress.  Menstruation.  Certain foods and drinks, such as:  Aged  cheese.  Chocolate.  Alcohol.  Caffeine.  Foods or drinks that contain nitrates, glutamate, aspartame, MSG, or tyramine.  Lack of sleep.  Hunger.  Physical exertion.  Fatigue.  High altitude.  Weather changes.  Medicines, such as:  Nitroglycerin, which is used to treat chest pain.  Birth control pills.  Estrogen.  Some blood pressure medicines. What are the signs or symptoms? Symptoms of this condition vary for each person and may include:  Pain that is usually only present on one side of the head. In some cases, the pain may be on both sides of the head or around the head or neck.  Pulsating or throbbing pain.  Severe pain that prevents daily activities.  Pain that is aggravated by any physical activity.  Nausea, vomiting, or both.  Dizziness.  Pain with exposure to bright lights, loud noises, or activity.  General sensitivity to bright lights, loud noises, or smells. Before you get a migraine, you may get warning signs that a migraine is coming (aura). An aura may include:  Seeing flashing lights.  Seeing bright spots, halos, or zigzag lines.  Having tunnel vision or blurred vision.  Having numbness or a tingling feeling.  Having trouble talking.  Having muscle weakness.  Smelling a certain odor. How is this diagnosed? This condition is often diagnosed based on:  Your symptoms and medical history.  A physical exam. You may also have tests, including:  A CT scan or MRI of your brain. These imaging tests cannot diagnose migraines, but they can help to rule out other causes of headaches.  Blood tests. How is  this treated? This condition is treated with:  Medicines. These are used for:  Lessening pain and nausea.  Preventing recurrent migraines.  Lifestyle changes, such as changes to your diet or sleeping patterns.  Behavior therapy, such as relaxation training or biofeedback. Biofeedback is a treatment that involves teaching you to  relax and use your brain to lower your heart rate and control your breathing. Follow these instructions at home: Medicines  Take over-the-counter and prescription medicines only as told by your health care provider.  Do not drive or use heavy machinery while taking prescription pain medicine. Lifestyle  Do not use any products that contain nicotine or tobacco, such as cigarettes and e-cigarettes. If you need help quitting, ask your health care provider.  Limit alcohol intake to no more than 1 drink a day for nonpregnant women and 2 drinks a day for men. One drink equals 12 oz of beer, 5 oz of wine, or 1 oz of hard liquor.  Get 7-9 hours of sleep each night, or the amount of sleep recommended by your health care provider.  Limit your stress. Talk with your health care provider if you need help with stress management.  Maintain a healthy weight. If you need help losing weight, ask your health care provider.  Exercise regularly. Aim for 150 minutes of moderate-intensity exercise (walking, biking, yoga) or 75 minutes of vigorous exercise (running, circuit training, swimming) each week. General instructions  Keep a journal to find out what triggers your migraine headaches so you can avoid these triggers. For example, write down:  What you eat and drink.  How much sleep you get.  Any change to your diet or medicines.  Lie down in a dark, quiet room when you have a migraine.  Try placing a cool towel over your head when you have a migraine.  Keep lights dim, if bright lights bother you and make your migraines worse.  Keep all follow-up visits as told by your health care provider. This is important. Contact a health care provider if:  Your pain does not improve, even with medicine.  Your migraines continue to return, even with medicine.  You have a fever.  You have weight loss. Get help right away if:  Your migraine becomes severe and medicine does not help.  You have a  stiff neck.  You have a loss of vision.  You have muscle weakness or loss of muscle control.  You start losing your balance or have trouble walking.  You feel faint or you pass out.  You develop new, severe symptoms.  You start having abrupt severe headaches that last for a second or less, like a thunderclap. Summary  Migraine headaches are usually stronger and more sudden than normal headaches (tension headaches). Migraines are characterized by an intense pulsing, throbbing pain that is usually only present on one side of the head.  The exact cause of this condition is not known. However, a migraine may be caused when nerves in the brain become irritated and release chemicals that cause inflammation of blood vessels.  Certain things may trigger migraines, such as changes to diet or sleeping patterns, smoking, certain foods, alcohol, stress, and certain medicines.  Sometimes, migraine headaches can cause nausea, vomiting, sensitivity to light and sound, and vision changes.  Migraines are often diagnosed based on your symptoms, medical history, and a physical exam. This information is not intended to replace advice given to you by your health care provider. Make sure you discuss any questions you  have with your health care provider. Document Released: 08/07/2001 Document Revised: 08/24/2016 Document Reviewed: 08/24/2016 Elsevier Interactive Patient Education  2017 [employer].

## 2016-10-09 NOTE — Progress Notes (Signed)
NEUROLOGY FOLLOW UP OFFICE NOTE  [name] [id number]  HISTORY OF PRESENT ILLNESS: [name] is a 50 year old right-handed woman with fibromyalgia, migraines and depression who follows up for chronic migraine.  UPDATE: Intensity:  7.5/10 Duration:  30 to 120 minutes Frequency:  16 headache days per month Current NSAIDS:  no Current analgesics:  Tramadol twice daily Current triptans:  Sumatriptan 100mg  Current anti-emetic:  no Current muscle relaxants:  Flexeril Current anti-anxiolytic:  Xanax Current sleep aide:  Ambien Current Antihypertensive medications:  no Current Antidepressant medications:  Sertraline 100mg  Current Anticonvulsant medications:  gabapentin Current Vitamins/Herbal/Supplements:  D, E Current Antihistamines/Decongestants:  no Other therapy:  no Other medication:  Adderall  Caffeine:  1 cherry Coke Alcohol:  occasionally Smoker:  no Diet:  Good.  Keeps hydrated Exercise:  no Depression/stress:  Significant stress and depression Sleep hygiene:  Does not sleep well.  Takes Ambien  HISTORY: Onset:  2007-08 Location:  Usually starts in shoulders and travels up neck to back of head and to the front of head Quality:  Pressure, throbbing Initial Intensity:  6/10 Aura:  no Prodrome:  no Associated symptoms:  Photophobia, phonophobia, osmophobia.  Sometimes nausea and vomiting.  No visual disturbance Initial Duration:  3 hours to all day.  Sometimes wakes up with it. Initial Frequency:  21 headache days per month (12-15 migraine headaches per month) Triggers/exacerbating factors:  Stress, light Relieving factors:  none Activity:  Has to leave work or come in late about 2-3 times per month  Past abortive therapy:  Ibuprofen, naproxen, Excedrin, acetaminophen Past preventative therapy:  amitriptyline (made headaches worse), topiramate (memory problems), nortriptyline Other past medication:  Effexor (for fibromyalgia),  tizanidine  Family history of headache:  Aunt has migraine.  Mom has history of headache related to uncontrolled blood pressure  For several months, she reports difficulty getting words out.  She denies word-finding difficulty.   PAST MEDICAL HISTORY: Past Medical History:  Diagnosis Date  . Anxiety   . Complication of anesthesia    pt states medications make her hyper instead of sleepy  . Depression   . Fibromyalgia    chronic fatigue, lower back pain,  . Fibromyalgia   . Headache(784.0)   . History of migraine headaches   . Prediabetes   . Sleep disturbances     MEDICATIONS: Current Outpatient Prescriptions on File Prior to Visit  Medication Sig Dispense Refill  . ALPRAZolam (XANAX) 0.25 MG tablet Take 1 tablet (0.25 mg total) by mouth 2 (two) times daily as needed. for anxiety 90 tablet 0  . amphetamine-dextroamphetamine (ADDERALL) 30 MG tablet Take 1 tablet by mouth 2 (two) times daily. 60 tablet 0  . cholecalciferol (VITAMIN D) 1000 units tablet Take 2,000 Units by mouth daily.    . cyclobenzaprine (FLEXERIL) 5 MG tablet Take 1 tablet (5 mg total) by mouth 3 (three) times daily as needed for muscle spasms. 90 tablet 1  . nortriptyline (PAMELOR) 10 MG capsule Take 1 capsule (10 mg total) by mouth at bedtime. 30 capsule 1  . sertraline (ZOLOFT) 100 MG tablet Take 1 tablet (100 mg total) by mouth 2 (two) times daily. 60 tablet 5  . SUMAtriptan (IMITREX) 100 MG tablet Take 1 tablet (100 mg total) by mouth every 2 (two) hours as needed for migraine or headache. May repeat in 2 hours if headache persists or recurs. 10 tablet 1  . traMADol (ULTRAM) 50 MG tablet TAKE ONE TABLET BY MOUTH EVERY 6 HOURS AS NEEDED  FOR PAIN 60 tablet 5  . valACYclovir (VALTREX) 1000 MG tablet Take 1,000 mg by mouth 2 (two) times daily.    . vitamin E 400 UNIT capsule Take 400 Units by mouth daily.    Marland Kitchen zolpidem (AMBIEN CR) 12.5 MG CR tablet TAKE ONE TABLET BY MOUTH AT BEDTIME 90 tablet 0   No current  facility-administered medications on file prior to visit.     ALLERGIES: Allergies  Allergen Reactions  . Codeine Itching  . Sulfur Itching    FAMILY HISTORY: Family History  Problem Relation Age of Onset  . Cancer Paternal Aunt   . Cancer Maternal Grandmother   . Bipolar disorder Sister   . Stroke Paternal Grandfather   . Thyroid disease Paternal Grandmother   . Cancer Paternal Uncle   . Colon cancer Neg Hx   . Esophageal cancer Neg Hx   . Rectal cancer Neg Hx   . Stomach cancer Neg Hx     SOCIAL HISTORY: Social History   Social History  . Marital status: Divorced    Spouse name: N/A  . Number of children: N/A  . Years of education: N/A   Occupational History  . Not on file.   Social History Main Topics  . Smoking status: Never Smoker  . Smokeless tobacco: Never Used  . Alcohol use 0.0 oz/week     Comment: 4 drinks per weekend.  . Drug use: No  . Sexual activity: Yes    Partners: Male   Other Topics Concern  . Not on file   Social History Narrative  . No narrative on file    REVIEW OF SYSTEMS: Constitutional: No fevers, chills, or sweats, no generalized fatigue, change in appetite Eyes: No visual changes, double vision, eye pain Ear, nose and throat: No hearing loss, ear pain, nasal congestion, sore throat Cardiovascular: No chest pain, palpitations Respiratory:  No shortness of breath at rest or with exertion, wheezes GastrointestinaI: No nausea, vomiting, diarrhea, abdominal pain, fecal incontinence Genitourinary:  No dysuria, urinary retention or frequency Musculoskeletal:  Neck pain, back pain Integumentary: No rash, pruritus, skin lesions Neurological: as above Psychiatric: depression, insomnia, anxiety Endocrine: No palpitations, fatigue, diaphoresis, mood swings, change in appetite, change in weight, increased thirst Hematologic/Lymphatic:  No purpura, petechiae. Allergic/Immunologic: no itchy/runny eyes, nasal congestion, recent allergic  reactions, rashes  PHYSICAL EXAM: Vitals:   10/09/16 0900  BP: 122/68  Pulse: 90   General: No acute distress.  Patient appears well-groomed.  normal body habitus. Head:  Normocephalic/atraumatic Eyes:  Fundi examined but not visualized Neck: supple, tenderness, full range of motion Heart:  Regular rate and rhythm Lungs:  Clear to auscultation bilaterally Back: paraspinal tenderness Neurological Exam: alert and oriented to person, place, and time. Attention span and concentration intact, recent and remote memory intact, fund of knowledge intact.  Speech fluent and not dysarthric, language intact.  CN II-XII intact. Bulk and tone normal, muscle strength 5/5 throughout.  Sensation to light touch, temperature and vibration intact.  Deep tendon reflexes 2+ throughout, toes downgoing.  Finger to nose and heel to shin testing intact.  Gait normal, Romberg negative.  IMPRESSION: Chronic migraine Depression I don't suspect any cognitive or language impairment.  Likely the word-production problems related to depression  PLAN: 1.  For many years, she has had over 15 headache days a month and has failed several preventative medications such as topiramate, venlafaxine, amitriptyline.  Since she is on several medications, I think she would benefit from Botox.  We will  submit for pre-authorization. 2.  Sumatriptan 100mg  3.  Discussed lifestyle modification.  Improve sleep hygiene, exercise, monitor food. 4.  She reports that she has a therapist.  She may want to consider cognitive behavioral therapy. 5.  Follow up.  30 minutes spent face to face with patient, over 50% spent counseling.  Metta Clines, DO  CC:  Jill Alexanders, MD

## 2016-10-15 ENCOUNTER — Telehealth: Payer: Self-pay

## 2016-10-15 ENCOUNTER — Telehealth: Payer: Self-pay | Admitting: Neurology

## 2016-10-15 MED ORDER — ONABOTULINUMTOXINA 100 UNITS IJ SOLR: 200.0000 [IU] | INTRAMUSCULAR | 3 refills | Status: DC

## 2016-10-15 NOTE — Telephone Encounter (Signed)
Botox does not require P.A. Pre-determination was initiated. Clinicals faxed to [phone].   Pending case number [id number]

## 2016-10-15 NOTE — Telephone Encounter (Signed)
[employer] called and they need a rx demographic and ins cards faxed to them at [phone]

## 2016-10-15 NOTE — Telephone Encounter (Signed)
Printed. Placed in provider's box for signature. Will await provider's signature in addition to pre-determination decision from insurance company before faxing to pharmacy.

## 2016-10-24 ENCOUNTER — Other Ambulatory Visit: Payer: Self-pay | Admitting: Family Medicine

## 2016-10-24 ENCOUNTER — Telehealth: Payer: Self-pay | Admitting: Neurology

## 2016-10-24 NOTE — Telephone Encounter (Signed)
Judeen Hammans called from [facility] in regards PT for  Botox/Dawn CB# [id number] EXT [phone]

## 2016-10-24 NOTE — Telephone Encounter (Signed)
Called in.

## 2016-10-24 NOTE — Telephone Encounter (Signed)
Okay to refill? 

## 2016-10-25 NOTE — Telephone Encounter (Signed)
Returned call to [place], vm left.

## 2016-10-31 NOTE — Telephone Encounter (Signed)
Received notice of approvial. From 10/30/16-04/30/17.

## 2016-11-02 ENCOUNTER — Ambulatory Visit (INDEPENDENT_AMBULATORY_CARE_PROVIDER_SITE_OTHER): Payer: BLUE CROSS/BLUE SHIELD | Admitting: Neurology

## 2016-11-02 DIAGNOSIS — G43009 Migraine without aura, not intractable, without status migrainosus: Secondary | ICD-10-CM | POA: Diagnosis not present

## 2016-11-02 MED ORDER — ONABOTULINUMTOXINA 100 UNITS IJ SOLR
155.0000 [IU] | Freq: Once | INTRAMUSCULAR | Status: AC
  Administered 2016-11-02: 155 [IU] via INTRAMUSCULAR

## 2016-11-02 NOTE — Progress Notes (Signed)
[facility]   Procedure Note Botox  Attending: Dr. Metta Clines  Preoperative Diagnosis(es): Chronic migraine  Consent obtained from: The patient Benefits discussed included, but were not limited to decreased muscle tightness, increased joint range of motion, and decreased pain.  Risk discussed included, but were not limited pain and discomfort, bleeding, bruising, excessive weakness, venous thrombosis, muscle atrophy and dysphagia.  Anticipated outcomes of the procedure as well as he risks and benefits of the alternatives to the procedure, and the roles and tasks of the personnel to be involved, were discussed with the patient, and the patient consents to the procedure and agrees to proceed. A copy of the patient medication guide was given to the patient which explains the blackbox warning.  Patients identity and treatment sites confirmed Yes.  .  Details of Procedure: Skin was cleaned with alcohol. Prior to injection, the needle plunger was aspirated to make sure the needle was not within a blood vessel.  There was no blood retrieved on aspiration.    Following is a summary of the muscles injected  And the amount of Botulinum toxin used:  Dilution 200 units of Botox was reconstituted with 4 ml of preservative free normal saline. Time of reconstitution: At the time of the office visit (<30 minutes prior to injection)   Injections  155 total units of Botox was injected with a 30 gauge needle.  Injection Sites: L occipitalis: 15 units- 3 sites  R occiptalis: 15 units- 3 sites  L upper trapezius: 15 units- 3 sites R upper trapezius: 15 units- 3 sits          L paraspinal: 10 units- 2 sites R paraspinal: 10 units- 2 sites  Face L frontalis(2 injection sites):10 units   R frontalis(2 injection sites):10 units         L corrugator: 5 units   R corrugator: 5 units           Procerus: 5 units   L temporalis: 20 units R temporalis: 20 units   Agent:  200 units of botulinum Type  A (Onobotulinum Toxin type A) was reconstituted with 4 ml of preservative free normal saline.  Time of reconstitution: At the time of the office visit (<30 minutes prior to injection)     Total injected (Units): 155  Total wasted (Units): 35  Patient tolerated procedure well without complications.   Reinjection is anticipated in 3 months.  Orvil Faraone R. Tomi Likens, DO

## 2016-12-14 ENCOUNTER — Telehealth: Payer: Self-pay | Admitting: Family Medicine

## 2016-12-14 ENCOUNTER — Other Ambulatory Visit: Payer: Self-pay

## 2016-12-14 MED ORDER — ALPRAZOLAM 0.25 MG PO TABS: 0.2500 mg | ORAL_TABLET | Freq: Two times a day (BID) | ORAL | 0 refills | Status: DC | PRN

## 2016-12-14 NOTE — Telephone Encounter (Signed)
Rcvd refill request for Xanax .25 mg #90

## 2016-12-14 NOTE — Telephone Encounter (Signed)
Called in xanax per [facility] 

## 2016-12-14 NOTE — Telephone Encounter (Signed)
Ok

## 2016-12-14 NOTE — Telephone Encounter (Signed)
Called in xanax per jcl 

## 2016-12-18 ENCOUNTER — Telehealth: Payer: Self-pay

## 2016-12-18 ENCOUNTER — Other Ambulatory Visit: Payer: Self-pay

## 2016-12-18 DIAGNOSIS — G43009 Migraine without aura, not intractable, without status migrainosus: Secondary | ICD-10-CM

## 2016-12-18 MED ORDER — SUMATRIPTAN SUCCINATE 100 MG PO TABS: 100.0000 mg | ORAL_TABLET | ORAL | 1 refills | Status: DC | PRN

## 2016-12-18 NOTE — Telephone Encounter (Signed)
Fax request for refill of imitrex to [facility] on [address] December

## 2016-12-18 NOTE — Telephone Encounter (Signed)
Called in imatrex

## 2017-01-18 ENCOUNTER — Telehealth: Payer: Self-pay | Admitting: Neurology

## 2017-01-18 NOTE — Telephone Encounter (Signed)
[name] 01/26/2066. Her # is [phone].  She has a Botox appointment scheduled for 02/01/17 at 8:00. She said she is not wanting to do the Botox but would still like to talk to Dr. Tomi Likens about an alternative. She does have a follow up appointment scheduled for 03/11/17 at 9:00. I think she would like to see him sooner than April. Thank you

## 2017-01-18 NOTE — Telephone Encounter (Signed)
Patient states she would like to cancel her upcoming Botox appt b/c she received a $1200 bill for Botox. Advised patient to speak w/ insurance and billing dept.  Patient agreed. Advised no earlier appts available. Patient taking HA meds as directed. Patient verbalized understanding.

## 2017-02-01 ENCOUNTER — Ambulatory Visit: Payer: BLUE CROSS/BLUE SHIELD | Admitting: Neurology

## 2017-02-04 ENCOUNTER — Other Ambulatory Visit: Payer: Self-pay | Admitting: Family Medicine

## 2017-02-05 ENCOUNTER — Other Ambulatory Visit: Payer: Self-pay | Admitting: Family Medicine

## 2017-02-05 NOTE — Telephone Encounter (Signed)
Is this okay to refill? 

## 2017-02-05 NOTE — Telephone Encounter (Signed)
Called in.

## 2017-02-07 ENCOUNTER — Other Ambulatory Visit: Payer: Self-pay | Admitting: Family Medicine

## 2017-02-07 NOTE — Telephone Encounter (Signed)
Is this okay to refill? 

## 2017-02-07 NOTE — Telephone Encounter (Signed)
ok 

## 2017-02-08 NOTE — Telephone Encounter (Signed)
Called in xanax per jcl 

## 2017-02-17 ENCOUNTER — Other Ambulatory Visit: Payer: Self-pay | Admitting: Family Medicine

## 2017-02-17 DIAGNOSIS — M797 Fibromyalgia: Secondary | ICD-10-CM

## 2017-02-18 ENCOUNTER — Telehealth: Payer: Self-pay | Admitting: Family Medicine

## 2017-02-18 DIAGNOSIS — F901 Attention-deficit hyperactivity disorder, predominantly hyperactive type: Secondary | ICD-10-CM

## 2017-02-18 MED ORDER — AMPHETAMINE-DEXTROAMPHETAMINE 30 MG PO TABS: 30.0000 mg | ORAL_TABLET | Freq: Two times a day (BID) | ORAL | 0 refills | Status: DC

## 2017-02-18 NOTE — Telephone Encounter (Signed)
Pt called and is requesting a refill on her adderall pt made a medcheck appt 04-12/2016, pt can be reached at [phone] when ready to be picked up

## 2017-02-18 NOTE — Telephone Encounter (Signed)
Is this okay to refill? 

## 2017-02-20 ENCOUNTER — Other Ambulatory Visit: Payer: Self-pay | Admitting: Family Medicine

## 2017-02-20 DIAGNOSIS — M797 Fibromyalgia: Secondary | ICD-10-CM

## 2017-02-25 ENCOUNTER — Ambulatory Visit (INDEPENDENT_AMBULATORY_CARE_PROVIDER_SITE_OTHER): Payer: BLUE CROSS/BLUE SHIELD | Admitting: Family Medicine

## 2017-02-25 ENCOUNTER — Encounter: Payer: Self-pay | Admitting: Family Medicine

## 2017-02-25 ENCOUNTER — Other Ambulatory Visit: Payer: Self-pay

## 2017-02-25 VITALS — BP 130/90 | HR 102 | Wt 176.4 lb

## 2017-02-25 DIAGNOSIS — F901 Attention-deficit hyperactivity disorder, predominantly hyperactive type: Secondary | ICD-10-CM | POA: Diagnosis not present

## 2017-02-25 DIAGNOSIS — M797 Fibromyalgia: Secondary | ICD-10-CM | POA: Diagnosis not present

## 2017-02-25 DIAGNOSIS — G43009 Migraine without aura, not intractable, without status migrainosus: Secondary | ICD-10-CM

## 2017-02-25 MED ORDER — AMPHETAMINE-DEXTROAMPHETAMINE 30 MG PO TABS: 30.0000 mg | ORAL_TABLET | Freq: Two times a day (BID) | ORAL | 0 refills | Status: DC

## 2017-02-25 MED ORDER — CYCLOBENZAPRINE HCL 5 MG PO TABS: 5.0000 mg | ORAL_TABLET | Freq: Three times a day (TID) | ORAL | 3 refills | Status: DC | PRN

## 2017-02-25 NOTE — Patient Instructions (Signed)
Talk to Dr. Tomi Likens about the nortriptyline and Neurontin. My suggestion is you slowly increase the Neurontin to 3 times per day and increase it by 100 mg increments

## 2017-02-25 NOTE — Progress Notes (Signed)
   Subjective:    Patient ID: [name], female    DOB: 12-05-1965, 51 y.o.   MRN: [id number]  HPI She is here for a follow-up visit. She has had one Botox injection and states follow-up injections are too expensive for her. She states that she can have headaches 4/7 days. She is scheduled to be seen April 9 by neurology and she remembers that she might be put on a blood pressure medication to help with the headaches.. She does state that on her last visit, she mentioned difficulty with what sounds like word searching but this was apparently not addressed. In the past she has tried Cymbalta as well as Effexor. Recently she was given nortriptyline but did not take it due to the pharmacist told her the potential side effects. She stop this without checking with anybody. She has gone to physical therapy was ordered through [facility] who is taking care of her fibromyalgia. Apparently she is noted no real improvement with that. He did tell her to take Neurontin and increase the dosing however she is not taking any more than 300 mg at bedtime. She continues in counseling and does state that this does help. She also needs a refill on her ADD medication.   .Review of Systems     Objective:   Physical Exam  alert and in no distress otherwise not examined        Assessment & Plan:  Migraine without aura and without status migrainosus, not intractable  Attention-deficit hyperactivity disorder, predominantly hyperactive type - Plan: amphetamine-dextroamphetamine (ADDERALL) 30 MG tablet, amphetamine-dextroamphetamine (ADDERALL) 30 MG tablet  Fibromyalgia muscle pain It might be worthwhile trying her again on Cymbalta/Effexor and even possibly nortriptyline. I will let this decision be made by neurology with input from Dr. Thereasa Parkin I explained that I thought the word searching she is talking about which does seem to be intermittent in nature could possibly be related to the many medications  she is taking. Encouraged her to discuss this with neurology.  I also discussed the fact that I did not want her to have to use the FMLA for her headaches and we need to work hard to get these under much better control so she won't need it. I am concerned that she will continue to use the FMLA and therefore not early want to get totally free of her headaches or at least use that as an excuse for being late to work

## 2017-03-11 ENCOUNTER — Ambulatory Visit: Payer: BLUE CROSS/BLUE SHIELD | Admitting: Neurology

## 2017-03-13 ENCOUNTER — Encounter: Payer: Self-pay | Admitting: Neurology

## 2017-03-18 ENCOUNTER — Other Ambulatory Visit: Payer: Self-pay | Admitting: Family Medicine

## 2017-03-18 NOTE — Telephone Encounter (Signed)
Called in xanax per [employer]

## 2017-03-18 NOTE — Telephone Encounter (Signed)
Is this okay to refill? 

## 2017-03-18 NOTE — Telephone Encounter (Signed)
ok 

## 2017-03-25 ENCOUNTER — Telehealth: Payer: Self-pay | Admitting: Family Medicine

## 2017-03-25 NOTE — Telephone Encounter (Signed)
Called pt wk reached voice mail lmtrc. Pt needs Dr. Loretta Plume HA specialist and Dr. Bethena Midget specialist to fill out FMLA forms. Await pt to call back to advise.

## 2017-03-25 NOTE — Telephone Encounter (Signed)
Left message for pt to call us back re: FMLA

## 2017-03-26 ENCOUNTER — Telehealth: Payer: Self-pay | Admitting: Family Medicine

## 2017-03-26 ENCOUNTER — Encounter: Payer: Self-pay | Admitting: Family Medicine

## 2017-03-26 NOTE — Telephone Encounter (Signed)
She needs to continue to be followed by neurology and not switch as this will interfere with her overall care and potentially getting the FMLA which they need to fill out not me. Remind her that I instructed her to take the medications as she was prescribed by the other physicians

## 2017-03-26 NOTE — Telephone Encounter (Signed)
PT returned my call.  I explained to her that since she was now seeing the specialist for her headaches and for fibromyalgia that you have asked that she send the FMLA forms to them for completion.  She then got upset and said they do not know her like you do.  She states she hasn't even seen them this year, that she has only seen them for 4 months.  She states that you just want to get her off of FMLA.    Pt states Dr. Thereasa Parkin is seeing her for Fibromyalgia and ordered Gabapentin for her and then you told her he had given her the wrong dose, and he isn't even giving her meds, that you are.    She doesn't like the bedside manner of Dr. Loretta Plume, since we did not specifically write all the things she needed to see him for, he would only discuss minimal issues.  She wants a different neurologist, that is not in that practice. She cannot afford Botox or physical therapy.  Pt states she always works 40 hrs per week.  She takes meds to wake up and meds to go to sleep.  She states she explained all of this to you last month at your visit.  Sometimes it just takes her a little while to get to work.  I explained that you had referred her to a specialist to better her outcomes.  Anything that can make her better is a common goal for everyone.    She wants to know if you want to see her again and discuss this as she states the specialist will not fill these forms out as they do not know her.

## 2017-04-27 ENCOUNTER — Other Ambulatory Visit: Payer: Self-pay | Admitting: Family Medicine

## 2017-04-29 ENCOUNTER — Telehealth: Payer: Self-pay

## 2017-04-29 ENCOUNTER — Other Ambulatory Visit: Payer: Self-pay | Admitting: Family Medicine

## 2017-04-29 NOTE — Telephone Encounter (Signed)
ambien called in per [employer] to [employer].

## 2017-04-30 NOTE — Telephone Encounter (Signed)
Is this okay to refill? 

## 2017-05-02 ENCOUNTER — Telehealth: Payer: Self-pay | Admitting: Family Medicine

## 2017-05-02 NOTE — Telephone Encounter (Signed)
Pt would like her [name]  [facility]  switched to the [facility] on [address], [place] pt can be reached at [phone]

## 2017-05-03 ENCOUNTER — Other Ambulatory Visit: Payer: Self-pay

## 2017-05-03 MED ORDER — ZOLPIDEM TARTRATE ER 12.5 MG PO TBCR: EXTENDED_RELEASE_TABLET | ORAL | 1 refills | Status: DC

## 2017-05-03 NOTE — Telephone Encounter (Signed)
I have [facility] med to [facility]

## 2017-05-03 NOTE — Telephone Encounter (Signed)
[facility] with 2 refills she called yesterday wanting RX [facility] with 1 refill

## 2017-06-11 ENCOUNTER — Other Ambulatory Visit: Payer: Self-pay

## 2017-06-11 ENCOUNTER — Telehealth: Payer: Self-pay

## 2017-06-11 MED ORDER — ALPRAZOLAM 0.25 MG PO TABS: 0.2500 mg | ORAL_TABLET | Freq: Two times a day (BID) | ORAL | 0 refills | Status: DC | PRN

## 2017-06-11 NOTE — Telephone Encounter (Signed)
Okay 

## 2017-06-11 NOTE — Telephone Encounter (Signed)
Called in xanax per jcl 

## 2017-06-11 NOTE — Telephone Encounter (Signed)
Called in xanax per Dr.Lalonde

## 2017-06-11 NOTE — Telephone Encounter (Signed)
Pt needs refill of xanax called into [facility] [place]. [name]

## 2017-07-21 ENCOUNTER — Other Ambulatory Visit: Payer: Self-pay | Admitting: Family Medicine

## 2017-07-21 DIAGNOSIS — G43009 Migraine without aura, not intractable, without status migrainosus: Secondary | ICD-10-CM

## 2017-07-22 ENCOUNTER — Other Ambulatory Visit: Payer: Self-pay | Admitting: Family Medicine

## 2017-07-22 NOTE — Telephone Encounter (Signed)
Is this okay to refill? 

## 2017-07-22 NOTE — Telephone Encounter (Signed)
ok 

## 2017-07-22 NOTE — Telephone Encounter (Signed)
Called meds in

## 2017-08-25 ENCOUNTER — Other Ambulatory Visit: Payer: Self-pay | Admitting: Family Medicine

## 2017-08-26 NOTE — Telephone Encounter (Signed)
LM for pt to [facility]- Dr. Redmond School wants to know what the plan of care is with [facility] neurology.   Called in [place] to [employer]. Victorino December

## 2017-12-18 ENCOUNTER — Other Ambulatory Visit: Payer: Self-pay | Admitting: Family Medicine

## 2017-12-18 NOTE — Telephone Encounter (Signed)
I am not sure this got sent in so make sure it was done

## 2017-12-18 NOTE — Telephone Encounter (Signed)
Can pt have a refill on meds  

## 2017-12-18 NOTE — Telephone Encounter (Signed)
P[harmact sent over request for refill on Ambien. [facility] on [address] is the pharmacy. Pt last med mgmt visit was April of last year. Called pt to schedule appointment but no answer left message to call office to schedule. Lease advise Thanks [facility]

## 2017-12-27 ENCOUNTER — Other Ambulatory Visit: Payer: Self-pay | Admitting: Family Medicine

## 2017-12-30 NOTE — Telephone Encounter (Signed)
Pharmacy is requesting zolpidem tartrate . Pt has upcoming appt 01-07-18. Please advise. Thanks kh

## 2018-01-07 ENCOUNTER — Encounter: Payer: Self-pay | Admitting: Family Medicine

## 2018-01-07 ENCOUNTER — Ambulatory Visit (INDEPENDENT_AMBULATORY_CARE_PROVIDER_SITE_OTHER): Payer: BLUE CROSS/BLUE SHIELD | Admitting: Family Medicine

## 2018-01-07 VITALS — BP 110/74 | HR 90 | Wt 178.0 lb

## 2018-01-07 DIAGNOSIS — G43009 Migraine without aura, not intractable, without status migrainosus: Secondary | ICD-10-CM

## 2018-01-07 DIAGNOSIS — M797 Fibromyalgia: Secondary | ICD-10-CM

## 2018-01-07 DIAGNOSIS — Z79899 Other long term (current) drug therapy: Secondary | ICD-10-CM

## 2018-01-07 DIAGNOSIS — F901 Attention-deficit hyperactivity disorder, predominantly hyperactive type: Secondary | ICD-10-CM | POA: Diagnosis not present

## 2018-01-07 MED ORDER — TRAMADOL HCL 50 MG PO TABS: 50.0000 mg | ORAL_TABLET | Freq: Four times a day (QID) | ORAL | 5 refills | Status: DC | PRN

## 2018-01-07 MED ORDER — AMPHETAMINE-DEXTROAMPHETAMINE 30 MG PO TABS: 30.0000 mg | ORAL_TABLET | Freq: Two times a day (BID) | ORAL | 0 refills | Status: DC

## 2018-01-07 MED ORDER — AMPHETAMINE-DEXTROAMPHETAMINE 20 MG PO TABS: 20.0000 mg | ORAL_TABLET | Freq: Two times a day (BID) | ORAL | 0 refills | Status: DC

## 2018-01-07 NOTE — Progress Notes (Signed)
   Subjective:    Patient ID: [name], female    DOB: 11/07/1966, 52 y.o.   MRN: [id number]  HPI She is here for a med check appointment.  Since last being seen she was seen by pain management as well as neurology in [place].  Those notes were reviewed.  These were one-time visits.  She does continue to complain of memory issues.  She continues on tramadol using 2 or 3/day.  60 pills last her a month.  She takes her Imitrex usually on a weekly basis.  She has been on gabapentin but only using it on weekends.  She also takes Ambien at night and uses Flexeril and Xanax as needed.  She has not seen a rheumatologist in quite some time.  She was involved in getting counseling but due to finances has stopped that.  She also has not returned for her ADD.  In the past she had taken Adderall twice per day to help with focus.  Also gave her energy.  She notes difficulty with hyperactivity dating back to grade school and be on.  She is comfortable with the Adderall helping. She was seen recently by her gynecologist.  Review of Systems     Objective:   Physical Exam Alert and in no distress. Tympanic membranes and canals are normal. Pharyngeal area is normal. Neck is supple without adenopathy or thyromegaly. Cardiac exam shows a regular sinus rhythm without murmurs or gallops. Lungs are clear to auscultation.        Assessment & Plan:  Migraine without aura and without status migrainosus, not intractable  Fibromyalgia muscle pain - Plan: CBC with Differential/Platelet, Comprehensive metabolic panel, Lipid panel, traMADol (ULTRAM) 50 MG tablet  Attention deficit hyperactivity disorder (ADHD), predominantly hyperactive type  Encounter for long-term (current) use of medications - Plan: CBC with Differential/Platelet, Comprehensive metabolic panel, Lipid panel  Attention-deficit hyperactivity disorder, predominantly hyperactive type - Plan: amphetamine-dextroamphetamine (ADDERALL) 30  MG tablet, amphetamine-dextroamphetamine (ADDERALL) 30 MG tablet, amphetamine-dextroamphetamine (ADDERALL) 20 MG tablet Recommend she check with neurology in [facility] concerning getting on more than new migraine medications.  Apparently Botox and other meds have not worked well for her.  I will continue her on her present medications and attempt to get her in to see Dr. Estanislado Pandy who she is seen in the past.  She seems relatively stable on her present medications other than complaining about memory issues.  I explained the fact that she is on multiple medications that can affect brain function.  Will defer any medication changes to rheumatology.

## 2018-01-08 LAB — CBC WITH DIFFERENTIAL/PLATELET
Basophils Absolute: 0 10*3/uL (ref 0.0–0.2)
Basos: 0 %
EOS (ABSOLUTE): 0.2 10*3/uL (ref 0.0–0.4)
Eos: 3 %
Hematocrit: 37.4 % (ref 34.0–46.6)
Hemoglobin: 12 g/dL (ref 11.1–15.9)
Immature Grans (Abs): 0 10*3/uL (ref 0.0–0.1)
Immature Granulocytes: 0 %
Lymphocytes Absolute: 2.3 10*3/uL (ref 0.7–3.1)
Lymphs: 44 %
MCH: 26.7 pg (ref 26.6–33.0)
MCHC: 32.1 g/dL (ref 31.5–35.7)
MCV: 83 fL (ref 79–97)
Monocytes Absolute: 0.5 10*3/uL (ref 0.1–0.9)
Monocytes: 9 %
Neutrophils Absolute: 2.3 10*3/uL (ref 1.4–7.0)
Neutrophils: 44 %
Platelets: 228 10*3/uL (ref 150–379)
RBC: 4.5 x10E6/uL (ref 3.77–5.28)
RDW: 14.6 % (ref 12.3–15.4)
WBC: 5.2 10*3/uL (ref 3.4–10.8)

## 2018-01-08 LAB — LIPID PANEL
Chol/HDL Ratio: 4.1 ratio (ref 0.0–4.4)
Cholesterol, Total: 280 mg/dL — ABNORMAL HIGH (ref 100–199)
HDL: 68 mg/dL (ref >39)
LDL Calculated: 184 mg/dL — ABNORMAL HIGH (ref 0–99)
Triglycerides: 140 mg/dL (ref 0–149)
VLDL Cholesterol Cal: 28 mg/dL (ref 5–40)

## 2018-01-08 LAB — COMPREHENSIVE METABOLIC PANEL
ALT: 12 IU/L (ref 0–32)
AST: 16 IU/L (ref 0–40)
Albumin/Globulin Ratio: 1.4 (ref 1.2–2.2)
Albumin: 4.3 g/dL (ref 3.5–5.5)
Alkaline Phosphatase: 119 IU/L — ABNORMAL HIGH (ref 39–117)
BUN/Creatinine Ratio: 15 (ref 9–23)
BUN: 12 mg/dL (ref 6–24)
Bilirubin Total: <0.2 mg/dL (ref 0.0–1.2)
CO2: 21 mmol/L (ref 20–29)
Calcium: 8.9 mg/dL (ref 8.7–10.2)
Chloride: 105 mmol/L (ref 96–106)
Creatinine, Ser: 0.79 mg/dL (ref 0.57–1.00)
GFR calc Af Amer: 99 mL/min/{1.73_m2} (ref >59)
GFR calc non Af Amer: 86 mL/min/{1.73_m2} (ref >59)
Globulin, Total: 3 g/dL (ref 1.5–4.5)
Glucose: 133 mg/dL — ABNORMAL HIGH (ref 65–99)
Potassium: 3.8 mmol/L (ref 3.5–5.2)
Sodium: 143 mmol/L (ref 134–144)
Total Protein: 7.3 g/dL (ref 6.0–8.5)

## 2018-01-16 ENCOUNTER — Other Ambulatory Visit: Payer: Self-pay | Admitting: Family Medicine

## 2018-01-16 DIAGNOSIS — M797 Fibromyalgia: Secondary | ICD-10-CM

## 2018-01-17 ENCOUNTER — Telehealth: Payer: Self-pay | Admitting: *Deleted

## 2018-01-17 NOTE — Telephone Encounter (Signed)
Dr Arlean Hopping office called to let you know that she declined the referral.

## 2018-01-21 ENCOUNTER — Telehealth: Payer: Self-pay

## 2018-01-21 NOTE — Telephone Encounter (Signed)
Have called rheumotolgist in [place] and none of the offices take fibromyalgia pt. [place] has an office called integrative medicine and I have called their office and awaiting a call back . Their number was [phone]. Just FYI on where we are on her referral . [facility]

## 2018-02-03 ENCOUNTER — Telehealth: Payer: Self-pay | Admitting: Family Medicine

## 2018-02-03 DIAGNOSIS — F901 Attention-deficit hyperactivity disorder, predominantly hyperactive type: Secondary | ICD-10-CM

## 2018-02-03 MED ORDER — AMPHETAMINE-DEXTROAMPHETAMINE 30 MG PO TABS: 30.0000 mg | ORAL_TABLET | Freq: Two times a day (BID) | ORAL | 0 refills | Status: DC

## 2018-02-03 NOTE — Telephone Encounter (Signed)
Pt called and stated that both of the adderall 20 mg and 30 mg [facility]. She is [facility] be [facility] into  [facility]. Pt can be reached at [phone].

## 2018-02-16 ENCOUNTER — Other Ambulatory Visit: Payer: Self-pay | Admitting: Family Medicine

## 2018-02-16 DIAGNOSIS — M797 Fibromyalgia: Secondary | ICD-10-CM

## 2018-02-17 NOTE — Telephone Encounter (Signed)
[facility] [address] [facility] to refill pt Zoloft pleaseadvise. Thanks [employer]

## 2018-02-27 ENCOUNTER — Other Ambulatory Visit: Payer: Self-pay | Admitting: Family Medicine

## 2018-02-28 NOTE — Telephone Encounter (Signed)
[facility] is [facility] to [facility] ambein. Please advise [facility]

## 2018-07-16 ENCOUNTER — Other Ambulatory Visit: Payer: Self-pay | Admitting: Family Medicine

## 2018-07-16 DIAGNOSIS — M797 Fibromyalgia: Secondary | ICD-10-CM

## 2018-07-16 NOTE — Telephone Encounter (Signed)
Is this okay to refill? 

## 2018-09-12 ENCOUNTER — Other Ambulatory Visit: Payer: Self-pay | Admitting: Family Medicine

## 2018-09-12 DIAGNOSIS — M797 Fibromyalgia: Secondary | ICD-10-CM

## 2018-09-12 NOTE — Telephone Encounter (Signed)
[facility] is [facility] to [facility] pt tramdol and [place]. Please advise [facility]

## 2018-12-31 ENCOUNTER — Telehealth: Payer: Self-pay | Admitting: Family Medicine

## 2018-12-31 NOTE — Telephone Encounter (Signed)
Dismissal letter in [employer]  °

## 2019-12-09 ENCOUNTER — Other Ambulatory Visit: Payer: Self-pay

## 2019-12-09 ENCOUNTER — Encounter: Payer: Self-pay | Admitting: Family Medicine

## 2019-12-09 ENCOUNTER — Ambulatory Visit: Payer: Managed Care, Other (non HMO) | Admitting: Family Medicine

## 2019-12-09 VITALS — BP 120/82 | HR 89 | Temp 98.4°F | Wt 172.6 lb

## 2019-12-09 DIAGNOSIS — F901 Attention-deficit hyperactivity disorder, predominantly hyperactive type: Secondary | ICD-10-CM | POA: Diagnosis not present

## 2019-12-09 DIAGNOSIS — G43009 Migraine without aura, not intractable, without status migrainosus: Secondary | ICD-10-CM | POA: Diagnosis not present

## 2019-12-09 DIAGNOSIS — Z79899 Other long term (current) drug therapy: Secondary | ICD-10-CM

## 2019-12-09 DIAGNOSIS — M797 Fibromyalgia: Secondary | ICD-10-CM | POA: Diagnosis not present

## 2019-12-09 DIAGNOSIS — F410 Panic disorder [episodic paroxysmal anxiety] without agoraphobia: Secondary | ICD-10-CM

## 2019-12-09 MED ORDER — TRAMADOL HCL 50 MG PO TABS: 50.0000 mg | ORAL_TABLET | Freq: Four times a day (QID) | ORAL | 0 refills | Status: DC | PRN

## 2019-12-09 MED ORDER — GABAPENTIN 100 MG PO CAPS: 100.0000 mg | ORAL_CAPSULE | Freq: Every day | ORAL | 1 refills | Status: DC

## 2019-12-09 MED ORDER — ZOLPIDEM TARTRATE ER 12.5 MG PO TBCR: 12.5000 mg | EXTENDED_RELEASE_TABLET | Freq: Every evening | ORAL | 1 refills | Status: DC | PRN

## 2019-12-09 MED ORDER — SERTRALINE HCL 100 MG PO TABS: ORAL_TABLET | ORAL | 1 refills | Status: DC

## 2019-12-09 MED ORDER — AMPHETAMINE-DEXTROAMPHETAMINE 30 MG PO TABS: 30.0000 mg | ORAL_TABLET | Freq: Two times a day (BID) | ORAL | 0 refills | Status: DC

## 2019-12-09 MED ORDER — CYCLOBENZAPRINE HCL 5 MG PO TABS: 5.0000 mg | ORAL_TABLET | Freq: Three times a day (TID) | ORAL | 3 refills | Status: DC | PRN

## 2019-12-09 NOTE — Progress Notes (Signed)
   Subjective:    Patient ID: [name], female    DOB: 1966-01-07, 54 y.o.   MRN: [id number]  HPI She returns here for medication check.  She has been unemployed since April 2019 and has been getting her medications from other sources, usually friends.  She now has a job as of November 2020.  She would like to be placed back on all of her other medications.  She does complain of multiple aches and pains.  She does complain of more pain in her right shoulder and also symptoms suggestive of trigger finger of right fourth finger.  Her migraines seem to be under much better control.  She does have underlying ADD and would like to be placed back on her medication.  She also recognizes psychologically she is not in a good spot and would like to placed back on her Zoloft.  She does also take Xanax.   Review of Systems     Objective:   Physical Exam Alert and in no distress with appropriate affect and dressed appropriately.       Assessment & Plan:  Fibromyalgia muscle pain - Plan: CBC with Differential, Comprehensive metabolic panel, cyclobenzaprine (FLEXERIL) 5 MG tablet, gabapentin (NEURONTIN) 100 MG capsule, sertraline (ZOLOFT) 100 MG tablet, traMADol (ULTRAM) 50 MG tablet, zolpidem (AMBIEN CR) 12.5 MG CR tablet  Attention-deficit hyperactivity disorder, predominantly hyperactive type - Plan: amphetamine-dextroamphetamine (ADDERALL) 30 MG tablet, amphetamine-dextroamphetamine (ADDERALL) 30 MG tablet, amphetamine-dextroamphetamine (ADDERALL) 30 MG tablet  Migraine without aura and without status migrainosus, not intractable  Encounter for long-term (current) use of medications - Plan: CBC with Differential, Comprehensive metabolic panel, Lipid panel  Panic attacks I would like to see her back in 1 or 2 months.  I did start several of her medications at a lower dosing including Neurontin and Zoloft.

## 2019-12-10 LAB — CBC WITH DIFFERENTIAL/PLATELET
Basophils Absolute: 0 10*3/uL (ref 0.0–0.2)
Basos: 1 %
EOS (ABSOLUTE): 0.1 10*3/uL (ref 0.0–0.4)
Eos: 1 %
Hematocrit: 39.6 % (ref 34.0–46.6)
Hemoglobin: 12.8 g/dL (ref 11.1–15.9)
Immature Grans (Abs): 0 10*3/uL (ref 0.0–0.1)
Immature Granulocytes: 0 %
Lymphocytes Absolute: 1.8 10*3/uL (ref 0.7–3.1)
Lymphs: 41 %
MCH: 28.1 pg (ref 26.6–33.0)
MCHC: 32.3 g/dL (ref 31.5–35.7)
MCV: 87 fL (ref 79–97)
Monocytes Absolute: 0.4 10*3/uL (ref 0.1–0.9)
Monocytes: 8 %
Neutrophils Absolute: 2.2 10*3/uL (ref 1.4–7.0)
Neutrophils: 49 %
Platelets: 254 10*3/uL (ref 150–450)
RBC: 4.55 x10E6/uL (ref 3.77–5.28)
RDW: 13.5 % (ref 11.7–15.4)
WBC: 4.5 10*3/uL (ref 3.4–10.8)

## 2019-12-10 LAB — COMPREHENSIVE METABOLIC PANEL
ALT: 14 IU/L (ref 0–32)
AST: 19 IU/L (ref 0–40)
Albumin/Globulin Ratio: 1.8 (ref 1.2–2.2)
Albumin: 4.5 g/dL (ref 3.8–4.9)
Alkaline Phosphatase: 114 IU/L (ref 39–117)
BUN/Creatinine Ratio: 12 (ref 9–23)
BUN: 8 mg/dL (ref 6–24)
Bilirubin Total: 0.4 mg/dL (ref 0.0–1.2)
CO2: 25 mmol/L (ref 20–29)
Calcium: 9.6 mg/dL (ref 8.7–10.2)
Chloride: 105 mmol/L (ref 96–106)
Creatinine, Ser: 0.68 mg/dL (ref 0.57–1.00)
GFR calc Af Amer: 114 mL/min/{1.73_m2} (ref >59)
GFR calc non Af Amer: 99 mL/min/{1.73_m2} (ref >59)
Globulin, Total: 2.5 g/dL (ref 1.5–4.5)
Glucose: 90 mg/dL (ref 65–99)
Potassium: 4.2 mmol/L (ref 3.5–5.2)
Sodium: 143 mmol/L (ref 134–144)
Total Protein: 7 g/dL (ref 6.0–8.5)

## 2019-12-10 LAB — LIPID PANEL
Chol/HDL Ratio: 3 ratio (ref 0.0–4.4)
Cholesterol, Total: 222 mg/dL — ABNORMAL HIGH (ref 100–199)
HDL: 73 mg/dL (ref >39)
LDL Chol Calc (NIH): 139 mg/dL — ABNORMAL HIGH (ref 0–99)
Triglycerides: 58 mg/dL (ref 0–149)
VLDL Cholesterol Cal: 10 mg/dL (ref 5–40)

## 2020-01-07 ENCOUNTER — Encounter: Payer: Self-pay | Admitting: Family Medicine

## 2020-01-07 ENCOUNTER — Ambulatory Visit: Payer: Managed Care, Other (non HMO) | Admitting: Family Medicine

## 2020-01-07 ENCOUNTER — Other Ambulatory Visit: Payer: Self-pay

## 2020-01-07 VITALS — BP 118/86 | HR 93 | Temp 96.4°F | Ht 62.0 in | Wt 164.6 lb

## 2020-01-07 DIAGNOSIS — F410 Panic disorder [episodic paroxysmal anxiety] without agoraphobia: Secondary | ICD-10-CM

## 2020-01-07 DIAGNOSIS — M797 Fibromyalgia: Secondary | ICD-10-CM | POA: Diagnosis not present

## 2020-01-07 DIAGNOSIS — Z79899 Other long term (current) drug therapy: Secondary | ICD-10-CM

## 2020-01-07 DIAGNOSIS — Z8619 Personal history of other infectious and parasitic diseases: Secondary | ICD-10-CM | POA: Diagnosis not present

## 2020-01-07 MED ORDER — VALACYCLOVIR HCL 1 G PO TABS: ORAL_TABLET | ORAL | 5 refills | Status: DC

## 2020-01-07 MED ORDER — GABAPENTIN 300 MG PO CAPS: 300.0000 mg | ORAL_CAPSULE | Freq: Two times a day (BID) | ORAL | 3 refills | Status: AC

## 2020-01-07 MED ORDER — ALPRAZOLAM 0.25 MG PO TABS: 0.2500 mg | ORAL_TABLET | Freq: Two times a day (BID) | ORAL | 0 refills | Status: DC | PRN

## 2020-01-07 MED ORDER — SERTRALINE HCL 100 MG PO TABS: ORAL_TABLET | ORAL | 1 refills | Status: DC

## 2020-01-07 NOTE — Progress Notes (Signed)
   Subjective:    Patient ID: [name], female    DOB: 11-21-1966, 54 y.o.   MRN: [id number]  HPI She is here for recheck.  She states that she is doing much better since she was placed back on the Zoloft although she still does have some anxiety mainly related around work.  She has been using a family members Xanax usually twice per week.  For some reason she did not get the Flexeril.  The computer shows that it was called in but she says that she did not get it.  She continues to do nicely on her ADD medicine and 1 pill lasts roughly 7 hours.  She is now taking Neurontin 200 mg/day and having no difficulty with that.  She would also like a refill on her Valtrex.  She states that when she does get stressed out, sometimes the herpes will act up.   Review of Systems     Objective:   Physical Exam Alert and in no distress otherwise not examined      Assessment & Plan:  Fibromyalgia muscle pain - Plan: gabapentin (NEURONTIN) 300 MG capsule, sertraline (ZOLOFT) 100 MG tablet  Encounter for long-term (current) use of medications  Panic attacks - Plan: ALPRAZolam (XANAX) 0.25 MG tablet  History of herpes genitalis - Plan: valACYclovir (VALTREX) 1000 MG tablet I will renew her medications.  She will slowly increase her Neurontin to 300 mg twice per day and keep me informed.  Also bump her Zoloft up to 100 mg twice per day.  I will also give her her own Xanax. I then discussed stress and stress management with her.  Gave her the phone number for Darryl Hyers and also discussed stress and anxiety in regard to using the serenity prayer as well as the golden rule. I will also follow-up on the Flexeril.

## 2020-01-07 NOTE — Patient Instructions (Addendum)
Take the gabapentin 300 mg tablet for a week or so daily then bump it up to twice per day Call Darryl Hyers at [phone]

## 2020-01-20 LAB — HM MAMMOGRAPHY

## 2020-01-28 ENCOUNTER — Ambulatory Visit: Payer: BLUE CROSS/BLUE SHIELD | Attending: Internal Medicine

## 2020-01-28 DIAGNOSIS — Z23 Encounter for immunization: Secondary | ICD-10-CM

## 2020-01-28 NOTE — Progress Notes (Signed)
   Covid-19 Vaccination Clinic  Name:  [name]    MRN: [id number] DOB: 24-Feb-1966  01/28/2020  [name] was observed post Covid-19 immunization for 15 minutes without incident. She was provided with Vaccine Information Sheet and instruction to access the V-Safe system.   [name] was instructed to call 911 with any severe reactions post vaccine: Marland Kitchen Difficulty breathing  . Swelling of face and throat  . A fast heartbeat  . A bad rash all over body  . Dizziness and weakness   Immunizations Administered    Name Date Dose VIS Date Route   Pfizer COVID-19 Vaccine 01/28/2020 12:06 PM 0.3 mL 11/06/2019 Intramuscular   Manufacturer: [place]   Lot: [id number]   [place]: [id number]

## 2020-02-10 ENCOUNTER — Other Ambulatory Visit: Payer: Self-pay | Admitting: Family Medicine

## 2020-02-10 DIAGNOSIS — M797 Fibromyalgia: Secondary | ICD-10-CM

## 2020-02-10 NOTE — Telephone Encounter (Signed)
[facility]. Please advise [facility] 

## 2020-02-23 ENCOUNTER — Ambulatory Visit: Payer: BLUE CROSS/BLUE SHIELD | Attending: Internal Medicine

## 2020-02-23 DIAGNOSIS — Z23 Encounter for immunization: Secondary | ICD-10-CM

## 2020-02-23 NOTE — Progress Notes (Signed)
   Covid-19 Vaccination Clinic  Name:  [name]    MRN: [id number] DOB: [birth year]  02/23/2020  [name] was observed post Covid-19 immunization for 15 minutes without incident. She was provided with Vaccine Information Sheet and instruction to access the V-Safe system.   [name] was instructed to call 911 with any severe reactions post vaccine: Marland Kitchen Difficulty breathing  . Swelling of face and throat  . A fast heartbeat  . A bad rash all over body  . Dizziness and weakness   Immunizations Administered    Name Date Dose VIS Date Route   Pfizer COVID-19 Vaccine 02/23/2020  3:28 PM 0.3 mL 11/06/2019 Intramuscular   Manufacturer: [place]   Lot: [id number]   [place]: [id number]

## 2020-04-15 ENCOUNTER — Other Ambulatory Visit: Payer: Self-pay | Admitting: Family Medicine

## 2020-04-15 DIAGNOSIS — M797 Fibromyalgia: Secondary | ICD-10-CM

## 2020-04-15 NOTE — Telephone Encounter (Signed)
Is this okay to refill? 

## 2020-06-07 ENCOUNTER — Other Ambulatory Visit: Payer: Self-pay | Admitting: Family Medicine

## 2020-06-07 DIAGNOSIS — M797 Fibromyalgia: Secondary | ICD-10-CM

## 2020-06-07 NOTE — Telephone Encounter (Signed)
Is this ok to refill?  

## 2020-06-14 ENCOUNTER — Other Ambulatory Visit: Payer: Self-pay | Admitting: Family Medicine

## 2020-06-14 DIAGNOSIS — M797 Fibromyalgia: Secondary | ICD-10-CM

## 2020-06-14 NOTE — Telephone Encounter (Signed)
St up a virtual visit

## 2020-06-15 NOTE — Telephone Encounter (Signed)
Pt has a virtual appt tomorrow. [place]

## 2020-06-16 ENCOUNTER — Encounter: Payer: Self-pay | Admitting: Family Medicine

## 2020-06-16 ENCOUNTER — Telehealth: Payer: Self-pay

## 2020-06-16 ENCOUNTER — Other Ambulatory Visit: Payer: Self-pay

## 2020-06-16 ENCOUNTER — Telehealth: Payer: Self-pay | Admitting: Family Medicine

## 2020-06-16 ENCOUNTER — Telehealth (INDEPENDENT_AMBULATORY_CARE_PROVIDER_SITE_OTHER): Payer: 59 | Admitting: Family Medicine

## 2020-06-16 VITALS — Ht 61.0 in | Wt 150.0 lb

## 2020-06-16 DIAGNOSIS — G43009 Migraine without aura, not intractable, without status migrainosus: Secondary | ICD-10-CM

## 2020-06-16 DIAGNOSIS — Z79899 Other long term (current) drug therapy: Secondary | ICD-10-CM

## 2020-06-16 DIAGNOSIS — M797 Fibromyalgia: Secondary | ICD-10-CM

## 2020-06-16 DIAGNOSIS — F901 Attention-deficit hyperactivity disorder, predominantly hyperactive type: Secondary | ICD-10-CM

## 2020-06-16 DIAGNOSIS — F329 Major depressive disorder, single episode, unspecified: Secondary | ICD-10-CM

## 2020-06-16 DIAGNOSIS — M25511 Pain in right shoulder: Secondary | ICD-10-CM

## 2020-06-16 DIAGNOSIS — G8929 Other chronic pain: Secondary | ICD-10-CM

## 2020-06-16 MED ORDER — CYCLOBENZAPRINE HCL 5 MG PO TABS: 5.0000 mg | ORAL_TABLET | Freq: Three times a day (TID) | ORAL | 3 refills | Status: DC | PRN

## 2020-06-16 MED ORDER — AMPHETAMINE-DEXTROAMPHETAMINE 30 MG PO TABS: 30.0000 mg | ORAL_TABLET | Freq: Two times a day (BID) | ORAL | 0 refills | Status: DC

## 2020-06-16 NOTE — Telephone Encounter (Signed)
Pt called and her Flexeril and adderall was sent to the wrong pharmacy they need to go to the  [place] ([place]), [place] [address] if you could please resend them

## 2020-06-16 NOTE — Telephone Encounter (Signed)
Please advise pt has virtual today. [place]

## 2020-06-16 NOTE — Telephone Encounter (Signed)
Pt was called to advise she need to schedule a follow up appt on her right  Shoulder. [place]

## 2020-06-16 NOTE — Progress Notes (Signed)
   Subjective:    Patient ID: [name], female    DOB: December 30, 1965, 54 y.o.   MRN: [id number]  HPI I connected with  [name] on 06/16/20 by a video enabled telemedicine application and verified that I am speaking with the correct person using two identifiers. I discussed the limitations of evaluation and management by telemedicine. The patient expressed understanding and agreed to proceed.  She is at home and I am in my office. She states that the Zoloft is working quite well for her underlying depression.  She is comfortable with his present dosing.  She did have difficulty recently with an aunt that died which has reduced her stress level.  She states that she rarely uses Xanax. She does have underlying fibromyalgia however has not been taking Flexeril and is not clear as to why that was stopped.  She is using tramadol 50 mg twice daily to help with the fibromyalgia and notes that if she does not use it, she will have a recurrence of overall aches and pains.  She did try gabapentin and did state that it apparently worked but there is no question as to whether it did and she also states that it was causing drowsiness therefore she stopped it.  She does have a history of migraine headaches however she has not had headaches in quite some time and knows to use Imitrex for this specific headache pattern.  She does have underlying ADD and finds the Adderall to be working quite nicely.  She does not necessarily use it daily. She now complains of a 1 year history of right arm pain that she blames on fibromyalgia but describes a popping and grinding sensation.  She also notes that when she gets under stress she gets a burning and numb feeling in her neck. Review of Systems     Objective:   Physical Exam  Alert and in no distress otherwise not examined      Assessment & Plan:  Fibromyalgia muscle pain     Encounter for long-term (current) use of  medications  Attention-deficit hyperactivity disorder, predominantly hyperactive type  Migraine without aura and without status migrainosus, not intractable  Chronic right shoulder pain  Attention deficit hyperactivity disorder (ADHD), predominantly hyperactive type  Reactive depression She is to continue on her present medication regimen to try back off on using the tramadol.  I will give her Flexeril and see if that will help with some of the aches and pains. The Adderall was renewed.  She has a good handle on how to take care of the migraines. She is to return here for follow-up on the right shoulder pain as this sounds like a rotator cuff type issue. Continue on her Zoloft. Discussed the burning sensation she has when she is stressed and recommend she use this as a way of her body letting her know that it is time to take a break and do some mild stretching type of exercises.  She will keep me informed on all these issues.  Over 35 minutes spent discussing all these issues with her.

## 2020-06-23 ENCOUNTER — Telehealth: Payer: Self-pay | Admitting: Family Medicine

## 2020-06-23 NOTE — Telephone Encounter (Signed)
P.A. TRAMADOL completed 

## 2020-06-28 ENCOUNTER — Other Ambulatory Visit: Payer: Self-pay

## 2020-06-28 ENCOUNTER — Ambulatory Visit (INDEPENDENT_AMBULATORY_CARE_PROVIDER_SITE_OTHER): Payer: 59 | Admitting: Family Medicine

## 2020-06-28 VITALS — BP 122/80 | HR 73 | Temp 98.3°F | Wt 159.4 lb

## 2020-06-28 DIAGNOSIS — M7581 Other shoulder lesions, right shoulder: Secondary | ICD-10-CM | POA: Diagnosis not present

## 2020-06-28 DIAGNOSIS — R413 Other amnesia: Secondary | ICD-10-CM | POA: Diagnosis not present

## 2020-06-28 DIAGNOSIS — M797 Fibromyalgia: Secondary | ICD-10-CM | POA: Diagnosis not present

## 2020-06-28 MED ORDER — TRIAMCINOLONE ACETONIDE 40 MG/ML IJ SUSP
40.0000 mg | Freq: Once | INTRAMUSCULAR | Status: AC
  Administered 2020-06-28: 40 mg via INTRAMUSCULAR

## 2020-06-28 MED ORDER — LIDOCAINE HCL 1 % IJ SOLN
10.0000 mL | Freq: Once | INTRAMUSCULAR | Status: AC
  Administered 2020-06-28: 10 mL via INTRADERMAL

## 2020-06-28 NOTE — Addendum Note (Signed)
Addended by: Elyse Jarvis on: 06/28/2020 04:45 PM   Modules accepted: Orders

## 2020-06-28 NOTE — Progress Notes (Signed)
   Subjective:    Patient ID: [name], female    DOB: 07-25-1966, 54 y.o.   MRN: [id number]  HPI She complains of a 1 year history of difficulty with right shoulder pain that she describes as a popping and locking sensation it does interfere with abduction as well as internal and external rotation.  She has also had some back discomfort but this usually responds well to physical therapy.  She does have an underlying history of fibromyalgia and is on multiple medications for this.  She also has concerns about her memory.  She does describe several episodes of going from one room into another and forgetting why she was there and leaving to go to the store with 1 item in mind and forgetting why she is going to the store.   Review of Systems     Objective:   Physical Exam Alert and in no distress otherwise not examined.  Full motion of the shoulder.  Drop arm test was uncomfortable.  Neer's and Hawkins test was negative.  Negative sulcus sign. MMSE of 29      Assessment & Plan:  Rotator cuff tendinitis, right  Fibromyalgia muscle pain  Memory loss Discussed the shoulder problem with her and think that an injection would be beneficial as well as physical therapy.  The shoulder was prepped with Betadine posterior laterally.  40 mg of Kenalog and 3 cc of Xylocaine was injected into the subacromial bursa without difficulty.  I will send her for physical therapy. I reassured her that at this point there is really no major issues with her memory but certainly be cognizant of it. Continue on her fibromyalgia medication as well as physical therapy.

## 2020-07-08 ENCOUNTER — Other Ambulatory Visit: Payer: Self-pay | Admitting: Medical

## 2020-07-08 DIAGNOSIS — M797 Fibromyalgia: Secondary | ICD-10-CM

## 2020-07-08 NOTE — Telephone Encounter (Signed)
Pt was notified.  

## 2020-07-08 NOTE — Telephone Encounter (Signed)
She should consider weaning off of the Ambien. This medication can have side effects, long term ones such as early onset dementia. I will give her this refill but I strongly encourage her to have a conversation with her PCP about weaning off the medication if she is taking it nightly.

## 2020-07-23 NOTE — Telephone Encounter (Signed)
PA approved.

## 2020-08-12 ENCOUNTER — Other Ambulatory Visit: Payer: Self-pay | Admitting: Family Medicine

## 2020-08-12 DIAGNOSIS — M797 Fibromyalgia: Secondary | ICD-10-CM

## 2020-08-12 NOTE — Telephone Encounter (Signed)
Ok to refill 

## 2020-08-12 NOTE — Telephone Encounter (Signed)
Did we send a dismissal letter to her and if so when was it dated?

## 2020-08-15 ENCOUNTER — Telehealth: Payer: Self-pay | Admitting: Family Medicine

## 2020-08-15 ENCOUNTER — Encounter: Payer: Self-pay | Admitting: Family Medicine

## 2020-08-15 DIAGNOSIS — M797 Fibromyalgia: Secondary | ICD-10-CM

## 2020-08-15 MED ORDER — ZOLPIDEM TARTRATE ER 12.5 MG PO TBCR: EXTENDED_RELEASE_TABLET | ORAL | 0 refills | Status: DC

## 2020-08-15 NOTE — Telephone Encounter (Signed)
Pt called requesting a refill on her ambien please send to the [place] ([place]), [place] [address]  Pt was ok to come back to the office in Jan 2021  Pt can be reached at [phone]

## 2020-08-23 ENCOUNTER — Other Ambulatory Visit: Payer: Self-pay | Admitting: Family Medicine

## 2020-08-23 DIAGNOSIS — M797 Fibromyalgia: Secondary | ICD-10-CM

## 2020-08-24 ENCOUNTER — Other Ambulatory Visit: Payer: Self-pay | Admitting: Family Medicine

## 2020-08-24 DIAGNOSIS — M797 Fibromyalgia: Secondary | ICD-10-CM

## 2020-08-26 ENCOUNTER — Telehealth: Payer: Self-pay | Admitting: Family Medicine

## 2020-08-26 DIAGNOSIS — M797 Fibromyalgia: Secondary | ICD-10-CM

## 2020-08-26 MED ORDER — TRAMADOL HCL 50 MG PO TABS: 50.0000 mg | ORAL_TABLET | Freq: Four times a day (QID) | ORAL | 0 refills | Status: DC | PRN

## 2020-08-26 NOTE — Telephone Encounter (Signed)
Pt left message needs refill Tramadol

## 2020-08-30 ENCOUNTER — Telehealth: Payer: Self-pay | Admitting: Family Medicine

## 2020-08-30 ENCOUNTER — Ambulatory Visit: Payer: BLUE CROSS/BLUE SHIELD | Attending: Internal Medicine

## 2020-08-30 DIAGNOSIS — Z23 Encounter for immunization: Secondary | ICD-10-CM

## 2020-08-30 NOTE — Progress Notes (Signed)
   Covid-19 Vaccination Clinic  Name:  ADALAE BAYSINGER    MRN: [id number] DOB: 1966-04-20  08/30/2020  [name] was observed post Covid-19 immunization for 15 minutes without incident. She was provided with Vaccine Information Sheet and instruction to access the V-Safe system.   Ms. Lucero was instructed to call 911 with any severe reactions post vaccine: Marland Kitchen Difficulty breathing  . Swelling of face and throat  . A fast heartbeat  . A bad rash all over body  . Dizziness and weakness

## 2020-08-30 NOTE — Telephone Encounter (Signed)
Per [place], this patient was not to be dismissed.  Per Maudie Mercury, she has called patient and advised of same.  Dismissal letter has been voided.

## 2020-09-07 ENCOUNTER — Other Ambulatory Visit: Payer: Self-pay | Admitting: Family Medicine

## 2020-09-07 DIAGNOSIS — M797 Fibromyalgia: Secondary | ICD-10-CM

## 2020-09-08 ENCOUNTER — Telehealth: Payer: Self-pay

## 2020-09-08 NOTE — Telephone Encounter (Signed)
Received a fax from [facility] for a refill on the pts. Sertraline 100mg  twice daily pt. Last apt was 06/28/20.

## 2020-09-08 NOTE — Telephone Encounter (Signed)
[facility] . Please advise [facility]

## 2020-09-14 ENCOUNTER — Other Ambulatory Visit: Payer: Self-pay | Admitting: Family Medicine

## 2020-09-14 DIAGNOSIS — M797 Fibromyalgia: Secondary | ICD-10-CM

## 2020-09-15 NOTE — Telephone Encounter (Signed)
[facility]. Please advise [facility] 

## 2020-10-04 ENCOUNTER — Telehealth: Payer: Self-pay

## 2020-10-04 DIAGNOSIS — F901 Attention-deficit hyperactivity disorder, predominantly hyperactive type: Secondary | ICD-10-CM

## 2020-10-04 DIAGNOSIS — M797 Fibromyalgia: Secondary | ICD-10-CM

## 2020-10-04 MED ORDER — AMPHETAMINE-DEXTROAMPHETAMINE 30 MG PO TABS: 30.0000 mg | ORAL_TABLET | Freq: Two times a day (BID) | ORAL | 0 refills | Status: DC

## 2020-10-04 MED ORDER — TRAMADOL HCL 50 MG PO TABS: 50.0000 mg | ORAL_TABLET | Freq: Four times a day (QID) | ORAL | 0 refills | Status: DC | PRN

## 2020-10-04 MED ORDER — ZOLPIDEM TARTRATE ER 12.5 MG PO TBCR: 12.5000 mg | EXTENDED_RELEASE_TABLET | Freq: Every day | ORAL | 0 refills | Status: DC

## 2020-10-04 NOTE — Telephone Encounter (Signed)
[name]

## 2020-10-04 NOTE — Telephone Encounter (Signed)
Pt is requesting all meds filled due to her coverage being terminated soon. Please advised if this is ok. Some are controled so please send them in. Kh

## 2020-10-04 NOTE — Telephone Encounter (Signed)
Let her know that I called in as many as I could possibly call him.  Some of them are on a 90-day supply schedule and can therefore not call them in any sooner

## 2020-11-10 ENCOUNTER — Other Ambulatory Visit: Payer: Self-pay | Admitting: Family Medicine

## 2020-11-10 DIAGNOSIS — M797 Fibromyalgia: Secondary | ICD-10-CM

## 2020-11-10 NOTE — Telephone Encounter (Signed)
[facility]. Please advise [facility] 

## 2020-12-04 ENCOUNTER — Other Ambulatory Visit: Payer: Self-pay | Admitting: Family Medicine

## 2020-12-04 DIAGNOSIS — M797 Fibromyalgia: Secondary | ICD-10-CM

## 2020-12-05 NOTE — Telephone Encounter (Signed)
[facility] . Please advise [facility] 

## 2021-01-05 ENCOUNTER — Telehealth: Payer: Self-pay | Admitting: Family Medicine

## 2021-01-05 DIAGNOSIS — F901 Attention-deficit hyperactivity disorder, predominantly hyperactive type: Secondary | ICD-10-CM

## 2021-01-05 DIAGNOSIS — M797 Fibromyalgia: Secondary | ICD-10-CM

## 2021-01-05 MED ORDER — TRAMADOL HCL 50 MG PO TABS: 50.0000 mg | ORAL_TABLET | Freq: Four times a day (QID) | ORAL | 0 refills | Status: DC | PRN

## 2021-01-05 MED ORDER — AMPHETAMINE-DEXTROAMPHETAMINE 30 MG PO TABS: 30.0000 mg | ORAL_TABLET | Freq: Two times a day (BID) | ORAL | 0 refills | Status: DC

## 2021-01-05 MED ORDER — AMPHETAMINE-DEXTROAMPHETAMINE 30 MG PO TABS
30.0000 mg | ORAL_TABLET | Freq: Two times a day (BID) | ORAL | 0 refills | Status: DC
Start: 2021-02-02 — End: 2021-06-02

## 2021-01-05 MED ORDER — AMPHETAMINE-DEXTROAMPHETAMINE 30 MG PO TABS
30.0000 mg | ORAL_TABLET | Freq: Two times a day (BID) | ORAL | 0 refills | Status: DC
Start: 2021-01-05 — End: 2021-06-02

## 2021-01-05 NOTE — Addendum Note (Signed)
Addended by: Denita Lung on: 01/05/2021 01:04 PM   Modules accepted: Orders

## 2021-01-05 NOTE — Telephone Encounter (Signed)
Pt called and left Vm that she needs refill for her adderall and her tramadol Please send to the  [place] ([place] [address] [place]

## 2021-01-07 ENCOUNTER — Telehealth: Payer: Self-pay

## 2021-01-07 DIAGNOSIS — M797 Fibromyalgia: Secondary | ICD-10-CM

## 2021-01-07 NOTE — Telephone Encounter (Signed)
P.A. TRAMADOL completed

## 2021-01-08 ENCOUNTER — Other Ambulatory Visit: Payer: Self-pay | Admitting: Family Medicine

## 2021-01-08 DIAGNOSIS — M797 Fibromyalgia: Secondary | ICD-10-CM

## 2021-01-08 DIAGNOSIS — F410 Panic disorder [episodic paroxysmal anxiety] without agoraphobia: Secondary | ICD-10-CM

## 2021-01-09 NOTE — Telephone Encounter (Signed)
[facility] . Please advise [facility]

## 2021-01-09 NOTE — Telephone Encounter (Signed)
[facility]. Please advise [facility] 

## 2021-01-13 ENCOUNTER — Telehealth: Payer: Self-pay

## 2021-01-13 MED ORDER — TRAMADOL HCL 50 MG PO TABS: 50.0000 mg | ORAL_TABLET | Freq: Four times a day (QID) | ORAL | 0 refills | Status: DC | PRN

## 2021-01-13 NOTE — Telephone Encounter (Signed)
Called pharmacy Tramadol 7 days supply went thru for $0, left message for pt

## 2021-01-13 NOTE — Telephone Encounter (Signed)
Please send to [facility]

## 2021-01-13 NOTE — Telephone Encounter (Signed)
Where exactly do I called this in?

## 2021-01-13 NOTE — Telephone Encounter (Signed)
Pt now has Friday insurance plan.  [employer] [facility] t# [phone] & Tramadol is covered but plan requires a 7 day fill first then can fill for 30 days, that is their requirement for any opioids.  Please send in Rx for Tramadol #28 for 7 days supply

## 2021-01-30 ENCOUNTER — Other Ambulatory Visit: Payer: Self-pay | Admitting: Family Medicine

## 2021-01-30 DIAGNOSIS — M797 Fibromyalgia: Secondary | ICD-10-CM

## 2021-01-30 NOTE — Telephone Encounter (Signed)
[facility]. Please advise [facility]

## 2021-01-30 NOTE — Telephone Encounter (Signed)
I cannot find any documentation as to how many per day this patient actually uses.  Last fill of tramadol was 2/18 for #28. Not well documented in chart.  Last OV was in 06/2020, scheduled for 3/24 with Dr. Redmond School.  He may be more willing to give larger quantities, knowing her and her history, than I can based on the information in her chart.  (Looks like at one point she was getting #120 at a time, which I will not do).  Please find out how many she is taking per day.  If not being sent back tonight, send to a provider in the office.

## 2021-01-31 NOTE — Telephone Encounter (Signed)
She called for refill 17 days after last refill.  My question is how is she taking, meaning how many per day (one every day, and sometimes two, taking two regularly every day, etc).  I'm trying to figure out how many to send in, until she next sees Dr. Redmond School.

## 2021-01-31 NOTE — Telephone Encounter (Signed)
Pt takes this for migraines and fibromyalgia. Pt does not have enough to last her until her 02/16/21 appointment Please advise [facility]

## 2021-02-01 ENCOUNTER — Other Ambulatory Visit: Payer: Self-pay | Admitting: Obstetrics and Gynecology

## 2021-02-01 DIAGNOSIS — Z1231 Encounter for screening mammogram for malignant neoplasm of breast: Secondary | ICD-10-CM

## 2021-02-01 NOTE — Telephone Encounter (Signed)
Pt says she takes a tablet in the am , lunch time and before bed. Please advise [facility]

## 2021-02-16 ENCOUNTER — Other Ambulatory Visit: Payer: Self-pay

## 2021-02-16 ENCOUNTER — Ambulatory Visit (INDEPENDENT_AMBULATORY_CARE_PROVIDER_SITE_OTHER): Payer: 59 | Admitting: Family Medicine

## 2021-02-16 ENCOUNTER — Encounter: Payer: Self-pay | Admitting: Family Medicine

## 2021-02-16 VITALS — BP 130/88 | HR 115 | Temp 98.2°F | Ht 61.0 in | Wt 175.8 lb

## 2021-02-16 DIAGNOSIS — G43009 Migraine without aura, not intractable, without status migrainosus: Secondary | ICD-10-CM | POA: Diagnosis not present

## 2021-02-16 DIAGNOSIS — Z1159 Encounter for screening for other viral diseases: Secondary | ICD-10-CM

## 2021-02-16 DIAGNOSIS — I83811 Varicose veins of right lower extremities with pain: Secondary | ICD-10-CM

## 2021-02-16 DIAGNOSIS — Z8619 Personal history of other infectious and parasitic diseases: Secondary | ICD-10-CM

## 2021-02-16 DIAGNOSIS — E7439 Other disorders of intestinal carbohydrate absorption: Secondary | ICD-10-CM

## 2021-02-16 DIAGNOSIS — R0600 Dyspnea, unspecified: Secondary | ICD-10-CM | POA: Diagnosis not present

## 2021-02-16 DIAGNOSIS — M797 Fibromyalgia: Secondary | ICD-10-CM

## 2021-02-16 DIAGNOSIS — Z0000 Encounter for general adult medical examination without abnormal findings: Secondary | ICD-10-CM

## 2021-02-16 DIAGNOSIS — F901 Attention-deficit hyperactivity disorder, predominantly hyperactive type: Secondary | ICD-10-CM

## 2021-02-16 DIAGNOSIS — F410 Panic disorder [episodic paroxysmal anxiety] without agoraphobia: Secondary | ICD-10-CM

## 2021-02-16 DIAGNOSIS — F329 Major depressive disorder, single episode, unspecified: Secondary | ICD-10-CM | POA: Diagnosis not present

## 2021-02-16 DIAGNOSIS — Z90710 Acquired absence of both cervix and uterus: Secondary | ICD-10-CM

## 2021-02-16 DIAGNOSIS — R0609 Other forms of dyspnea: Secondary | ICD-10-CM

## 2021-02-16 DIAGNOSIS — Z79899 Other long term (current) drug therapy: Secondary | ICD-10-CM

## 2021-02-16 LAB — POCT URINALYSIS DIP (PROADVANTAGE DEVICE)
Bilirubin, UA: NEGATIVE
Blood, UA: NEGATIVE
Glucose, UA: NEGATIVE mg/dL
Ketones, POC UA: NEGATIVE mg/dL
Nitrite, UA: NEGATIVE
Protein Ur, POC: NEGATIVE mg/dL
Specific Gravity, Urine: 1.02
Urobilinogen, Ur: 0.2
pH, UA: 6 (ref 5.0–8.0)

## 2021-02-16 MED ORDER — SERTRALINE HCL 100 MG PO TABS: 100.0000 mg | ORAL_TABLET | Freq: Two times a day (BID) | ORAL | 0 refills | Status: DC

## 2021-02-16 MED ORDER — TRAMADOL HCL 50 MG PO TABS: 50.0000 mg | ORAL_TABLET | Freq: Four times a day (QID) | ORAL | 1 refills | Status: DC | PRN

## 2021-02-16 MED ORDER — CYCLOBENZAPRINE HCL 5 MG PO TABS: 5.0000 mg | ORAL_TABLET | Freq: Two times a day (BID) | ORAL | 3 refills | Status: DC | PRN

## 2021-02-16 MED ORDER — VALACYCLOVIR HCL 1 G PO TABS
ORAL_TABLET | ORAL | 5 refills | Status: DC
Start: 2021-02-16 — End: 2021-12-29

## 2021-02-16 NOTE — Progress Notes (Signed)
   Subjective:    Patient ID: [name], female    DOB: [birth year], 55 y.o.   MRN: [id number]  HPI She is here for a complete examination.  She does have underlying fibromyalgia as well as ADD and depression.  The ADD seems to be handled well with her Adderall.  She uses tramadol, Ambien, sertraline, Xanax and Flexeril for control of her fibromyalgia.  It has been somewhat successful but she still has difficulty with depression.  She is no longer using gabapentin as it makes her too drowsy.  She has been in counseling in the past but found it to be less than useful.  She has quit work because of the fibromyalgia and fatigue issue she states that she now notes that she is grinding her teeth.  She also complains of varicose vein on the right it is uncomfortable.  She rarely has difficulty with migraine headaches.  She does occasionally need Valtrex for her herpes genitalis.   Review of Systems Negative except as above    Objective:   Physical Exam Alert and in no distress. Tympanic membranes and canals are normal. Pharyngeal area is normal. Neck is supple without adenopathy or thyromegaly. Cardiac exam shows a regular sinus rhythm without murmurs or gallops. Lungs are clear to auscultation. EKG read by me shows a normal sinus rhythm.  Other parameters totally normal.  Normal R wave progression.  No acute changes noted. Urine microscopic was negative      Assessment & Plan:  Routine general medical examination at health care facility - Plan: POCT Urinalysis DIP (Proadvantage Device), Lipid panel, CBC with Differential, Comprehensive metabolic panel  Reactive depression  Panic attacks  Migraine without aura and without status migrainosus, not intractable  S/P laparoscopic assisted vaginal hysterectomy (LAVH)  Attention deficit hyperactivity disorder (ADHD), predominantly hyperactive type  Fibromyalgia muscle pain - Plan: traMADol (ULTRAM) 50 MG tablet, sertraline (ZOLOFT) 100  MG tablet, cyclobenzaprine (FLEXERIL) 5 MG tablet  History of herpes genitalis - Plan: valACYclovir (VALTREX) 1000 MG tablet  Encounter for long-term (current) use of medications - Plan: Lipid panel, CBC with Differential, Comprehensive metabolic panel  Need for hepatitis C screening test - Plan: Hepatitis C antibody  Dyspnea on exertion - Plan: EKG 12-Lead, Brain natriuretic peptide  Varicose veins of right lower extremity with pain  I will continue her on her present medication regimen but explained that referral to [facility] might possibly cause some his medications to be readjusted.  I will do a BNP to further evaluate her cardiac status and may possibly need to be referred to cardiology. Recommend she call a vein clinic for evaluation of her varicose veins. She is to check with her gynecologist concerning possible need of Pap, pelvic and mammogram.

## 2021-02-16 NOTE — Patient Instructions (Signed)
Call [facility] 336 [phone]

## 2021-02-17 ENCOUNTER — Encounter: Payer: Self-pay | Admitting: Family Medicine

## 2021-02-17 LAB — LIPID PANEL
Chol/HDL Ratio: 3.5 ratio (ref 0.0–4.4)
Cholesterol, Total: 253 mg/dL — ABNORMAL HIGH (ref 100–199)
HDL: 73 mg/dL (ref >39)
LDL Chol Calc (NIH): 171 mg/dL — ABNORMAL HIGH (ref 0–99)
Triglycerides: 59 mg/dL (ref 0–149)
VLDL Cholesterol Cal: 9 mg/dL (ref 5–40)

## 2021-02-17 LAB — CBC WITH DIFFERENTIAL/PLATELET
Basophils Absolute: 0 10*3/uL (ref 0.0–0.2)
Basos: 1 %
EOS (ABSOLUTE): 0.2 10*3/uL (ref 0.0–0.4)
Eos: 3 %
Hematocrit: 37.6 % (ref 34.0–46.6)
Hemoglobin: 12.2 g/dL (ref 11.1–15.9)
Immature Grans (Abs): 0 10*3/uL (ref 0.0–0.1)
Immature Granulocytes: 0 %
Lymphocytes Absolute: 1.7 10*3/uL (ref 0.7–3.1)
Lymphs: 34 %
MCH: 27.7 pg (ref 26.6–33.0)
MCHC: 32.4 g/dL (ref 31.5–35.7)
MCV: 86 fL (ref 79–97)
Monocytes Absolute: 0.4 10*3/uL (ref 0.1–0.9)
Monocytes: 8 %
Neutrophils Absolute: 2.8 10*3/uL (ref 1.4–7.0)
Neutrophils: 54 %
Platelets: 201 10*3/uL (ref 150–450)
RBC: 4.4 x10E6/uL (ref 3.77–5.28)
RDW: 13 % (ref 11.7–15.4)
WBC: 5.1 10*3/uL (ref 3.4–10.8)

## 2021-02-17 LAB — COMPREHENSIVE METABOLIC PANEL
ALT: 14 IU/L (ref 0–32)
AST: 22 IU/L (ref 0–40)
Albumin/Globulin Ratio: 1.7 (ref 1.2–2.2)
Albumin: 4.3 g/dL (ref 3.8–4.9)
Alkaline Phosphatase: 110 IU/L (ref 44–121)
BUN/Creatinine Ratio: 14 (ref 9–23)
BUN: 11 mg/dL (ref 6–24)
Bilirubin Total: 0.3 mg/dL (ref 0.0–1.2)
CO2: 22 mmol/L (ref 20–29)
Calcium: 9.4 mg/dL (ref 8.7–10.2)
Chloride: 103 mmol/L (ref 96–106)
Creatinine, Ser: 0.8 mg/dL (ref 0.57–1.00)
Globulin, Total: 2.6 g/dL (ref 1.5–4.5)
Glucose: 119 mg/dL — ABNORMAL HIGH (ref 65–99)
Potassium: 3.9 mmol/L (ref 3.5–5.2)
Sodium: 141 mmol/L (ref 134–144)
Total Protein: 6.9 g/dL (ref 6.0–8.5)
eGFR: 86 mL/min/{1.73_m2} (ref >59)

## 2021-02-17 LAB — HEPATITIS C ANTIBODY: Hep C Virus Ab: <0.1 s/co ratio (ref 0.0–0.9)

## 2021-02-17 LAB — BRAIN NATRIURETIC PEPTIDE: BNP: 28.8 pg/mL (ref 0.0–100.0)

## 2021-02-20 ENCOUNTER — Other Ambulatory Visit: Payer: Self-pay

## 2021-02-20 DIAGNOSIS — R0602 Shortness of breath: Secondary | ICD-10-CM

## 2021-02-20 DIAGNOSIS — E7439 Other disorders of intestinal carbohydrate absorption: Secondary | ICD-10-CM | POA: Insufficient documentation

## 2021-02-27 ENCOUNTER — Telehealth: Payer: Self-pay | Admitting: Family Medicine

## 2021-02-27 NOTE — Telephone Encounter (Signed)
Received requested records from [facility]

## 2021-03-13 ENCOUNTER — Other Ambulatory Visit: Payer: Self-pay | Admitting: Family Medicine

## 2021-03-13 DIAGNOSIS — F410 Panic disorder [episodic paroxysmal anxiety] without agoraphobia: Secondary | ICD-10-CM

## 2021-03-13 LAB — HGB A1C W/O EAG: Hgb A1c MFr Bld: 5.9 % — ABNORMAL HIGH (ref 4.8–5.6)

## 2021-03-13 LAB — SPECIMEN STATUS REPORT

## 2021-03-14 MED ORDER — ALPRAZOLAM 0.25 MG PO TABS: 0.2500 mg | ORAL_TABLET | Freq: Two times a day (BID) | ORAL | 0 refills | Status: DC | PRN

## 2021-03-14 NOTE — Telephone Encounter (Signed)
pt is requesting to fill xanax at [place]. Please advise   [facility]

## 2021-03-28 ENCOUNTER — Other Ambulatory Visit: Payer: Self-pay

## 2021-03-28 ENCOUNTER — Ambulatory Visit
Admission: RE | Admit: 2021-03-28 | Discharge: 2021-03-28 | Disposition: A | Discharge status: Home / Self Care | Payer: BLUE CROSS/BLUE SHIELD | Source: Ambulatory Visit | Attending: Obstetrics and Gynecology | Admitting: Obstetrics and Gynecology

## 2021-03-28 DIAGNOSIS — Z1231 Encounter for screening mammogram for malignant neoplasm of breast: Secondary | ICD-10-CM

## 2021-04-05 ENCOUNTER — Ambulatory Visit: Payer: 59 | Attending: Internal Medicine

## 2021-04-05 DIAGNOSIS — Z23 Encounter for immunization: Secondary | ICD-10-CM

## 2021-04-05 NOTE — Progress Notes (Signed)
   Covid-19 Vaccination Clinic  Name:  RAMIA SIDNEY    MRN: [id number] DOB: [birth year]  04/05/2021  [name] was observed post Covid-19 immunization for 15 minutes without incident. She was provided with Vaccine Information Sheet and instruction to access the V-Safe system.   Ms. Safranek was instructed to call 911 with any severe reactions post vaccine: Marland Kitchen Difficulty breathing  . Swelling of face and throat  . A fast heartbeat  . A bad rash all over body  . Dizziness and weakness   Immunizations Administered    Name Date Dose VIS Date Route   PFIZER Comrnaty(Gray TOP) Covid-19 Vaccine 04/05/2021 12:16 PM 0.3 mL 11/03/2020 Intramuscular   Manufacturer: [employer], [employer]   Lot: [id number]   NDC: [phone]

## 2021-04-10 ENCOUNTER — Other Ambulatory Visit (HOSPITAL_COMMUNITY): Payer: Self-pay

## 2021-04-10 MED ORDER — COVID-19 MRNA VAC-TRIS(PFIZER) 30 MCG/0.3ML IM SUSP
INTRAMUSCULAR | 0 refills | Status: DC
  Filled 2021-04-10: qty 0.3, 17d supply, fill #0

## 2021-04-14 ENCOUNTER — Other Ambulatory Visit: Payer: Self-pay | Admitting: Family Medicine

## 2021-04-14 DIAGNOSIS — M797 Fibromyalgia: Secondary | ICD-10-CM

## 2021-04-14 NOTE — Telephone Encounter (Signed)
[facility] . Please advise [facility] 

## 2021-05-24 NOTE — Progress Notes (Signed)
Cardiology Office Note:    Date:  05/25/2021   ID:  [name], DOB Mar 04, 1966, MRN [id number]  PCP:  Denita Lung, MD  Cardiologist:  None   Referring MD: Denita Lung, MD   Chief Complaint  Patient presents with   Shortness of Breath     History of Present Illness:    [name] is a 55 y.o. female with a hx of prediabetes, sleep disturbance, and shortness of breath.   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  She feels tired all the time.  Does not sleep well she feels because of fibromyalgia.  She does take Ambien.  She has recurring burning chest discomfort associated with nausea.  Occurs mostly at rest.  Not made worse by physical activity.  Occasionally has right arm discomfort.  Does have reflux of acid like content anterior esophagus.  Has a history of untreated hyperlipidemia and prediabetes.  Family history on her mother side of CAD (maternal grandmother).  Past Medical History:  Diagnosis Date   Anxiety    Complication of anesthesia    pt states medications make her hyper instead of sleepy   Depression    Fibromyalgia    chronic fatigue, lower back pain,   Fibromyalgia    Headache(784.0)    History of migraine headaches    Prediabetes    Sleep disturbances     Past Surgical History:  Procedure Laterality Date   ANAL FISSURE REPAIR  1980's   LAPAROSCOPIC ASSISTED VAGINAL HYSTERECTOMY N/A 01/16/2013   Procedure: LAPAROSCOPIC ASSISTED VAGINAL HYSTERECTOMY;  Surgeon: Marvene Staff, MD;  Location: [facility];  Service: Gynecology;  Laterality: N/A;   SALPINGOOPHORECTOMY Bilateral 01/16/2013   Procedure: SALPINGO OOPHORECTOMY;  Surgeon: Marvene Staff, MD;  Location: [place] [facility];  Service: Gynecology;  Laterality: Bilateral;   TUBAL LIGATION      Current Medications: Current Meds  Medication  Sig   ALPRAZolam (XANAX) 0.25 MG tablet Take 1 tablet (0.25 mg total) by mouth 2 (two) times daily as needed. for anxiety   amphetamine-dextroamphetamine (ADDERALL) 30 MG tablet Take 1 tablet by mouth 2 (two) times daily.   amphetamine-dextroamphetamine (ADDERALL) 30 MG tablet Take 1 tablet by mouth 2 (two) times daily.   amphetamine-dextroamphetamine (ADDERALL) 30 MG tablet Take 1 tablet by mouth 2 (two) times daily.   cholecalciferol (VITAMIN D) 1000 units tablet Take 2,000 Units by mouth daily.   COVID-19 mRNA Vac-TriS, Pfizer, SUSP injection Inject into the muscle.   cyclobenzaprine (FLEXERIL) 5 MG tablet Take 1 tablet (5 mg total) by mouth 2 (two) times daily as needed. for muscle spams   gabapentin (NEURONTIN) 300 MG capsule Take 1 capsule (300 mg total) by mouth 2 (two) times daily.   sertraline (ZOLOFT) 100 MG tablet Take 1 tablet (100 mg total) by mouth 2 (two) times daily.   SUMAtriptan (IMITREX) 100 MG tablet TAKE ONE TABLET BY MOUTH EVERY 2 HOURS AS NEEDED FOR MIGRAINE/HEADACHE   traMADol (ULTRAM) 50 MG tablet Take 1 tablet (50 mg total) by mouth every 6 (six) hours as needed. for pain   valACYclovir (VALTREX) 1000 MG tablet 1 pill daily for 5 days per outbreak   vitamin E 400 UNIT capsule Take 400 Units by mouth daily.   zolpidem (AMBIEN CR) 12.5 MG CR tablet TAKE 1 TABLET (12.5 MG TOTAL) BY MOUTH AT BEDTIME.     Allergies:  Codeine and Elemental sulfur   Social History   Socioeconomic History   Marital status: Divorced    Spouse name: Not on file   Number of children: Not on file   Years of education: Not on file   Highest education level: Not on file  Occupational History   Not on file  Tobacco Use   Smoking status: Never   Smokeless tobacco: Never  Substance and Sexual Activity   Alcohol use: Not Currently   Drug use: No   Sexual activity: Yes    Partners: Male  Other Topics Concern   Not on file  Social History Narrative   Not on file   Social Determinants  of Health   Financial Resource Strain: Not on file  Food Insecurity: Not on file  Transportation Needs: Not on file  Physical Activity: Not on file  Stress: Not on file  Social Connections: Not on file     Family History: The patient's family history includes Bipolar disorder in her sister; Cancer in her maternal grandmother, paternal aunt, and paternal uncle; Stroke in her paternal grandfather; Thyroid disease in her paternal grandmother. There is no history of Colon cancer, Esophageal cancer, Rectal cancer, or Stomach cancer.  ROS:   Please see the history of present illness.    No cardiac imaging data.  No prior cardiac work-up.  Fibromyalgia, acid reflux, palpitations, and difficulty sleeping.  All other systems reviewed and are negative.  EKGs/Labs/Other Studies Reviewed:    The following studies were reviewed today: LDL cholesterol 171 March 2022 total cholesterol 253. Hemoglobin A1c 5.9%.  EKG:  EKG normal sinus rhythm with normal overall appearance.  Recent Labs: 02/16/2021: ALT 14; BNP 28.8; BUN 11; Creatinine, Ser 0.80; Hemoglobin 12.2; Platelets 201; Potassium 3.9; Sodium 141  Recent Lipid Panel    Component Value Date/Time   CHOL 253 (H) 02/16/2021 1605   TRIG 59 02/16/2021 1605   HDL 73 02/16/2021 1605   CHOLHDL 3.5 02/16/2021 1605   CHOLHDL 3.5 07/19/2015 0001   VLDL 14 07/19/2015 0001   LDLCALC 171 (H) 02/16/2021 1605    Physical Exam:    VS:  BP 116/82   Pulse 90   Ht 5\' 2"  (1.575 m)   Wt 172 lb 3.2 oz (78.1 kg)   LMP 01/12/2013   SpO2 98%   BMI 31.50 kg/m     Wt Readings from Last 3 Encounters:  05/25/21 172 lb 3.2 oz (78.1 kg)  02/16/21 175 lb 12.8 oz (79.7 kg)  06/28/20 159 lb 6.4 oz (72.3 kg)     GEN: Overweight. No acute distress HEENT: Normal NECK: No JVD. LYMPHATICS: No lymphadenopathy CARDIAC: No murmur. RRR no gallop, or edema. VASCULAR:  Normal Pulses. No bruits. RESPIRATORY:  Clear to auscultation without rales, wheezing or  rhonchi  ABDOMEN: Soft, non-tender, non-distended, No pulsatile mass, MUSCULOSKELETAL: No deformity  SKIN: Warm and dry NEUROLOGIC:  Alert and oriented x 3 PSYCHIATRIC:  Normal affect   ASSESSMENT:    1. Shortness of breath   2. Chest discomfort   3. Pure hypercholesterolemia   4. Prediabetes   5. Palpitations   6. Attention deficit hyperactivity disorder (ADHD), predominantly hyperactive type   7. Reactive depression    PLAN:    In order of problems listed above:  Uncertain etiology.  2D Doppler echocardiogram to assess for diastolic dysfunction, pulmonary hypertension, or unsuspected cardiomyopathy. Coronary calcium score.  If significant plaque, will need either coronary CTA or functional testing to rule out obstructive disease.  LDL is very elevated.  Coronary calcium score will help guide management strategy. I pointed out that she has prediabetes and we discussed measures to better control.  Her elevated lipids and blood sugar control was spoken of as silent killers and that she should hold her physicians responsible for primary prevention. May consider monitoring if continues.   Medication Adjustments/Labs and Tests Ordered: Current medicines are reviewed at length with the patient today.  Concerns regarding medicines are outlined above.  Orders Placed This Encounter  Procedures   CT CARDIAC SCORING (SELF PAY ONLY)   ECHOCARDIOGRAM COMPLETE   No orders of the defined types were placed in this encounter.   Patient Instructions  Medication Instructions:  Your physician recommends that you continue on your current medications as directed. Please refer to the Current Medication list given to you today.  *If you need a refill on your cardiac medications before your next appointment, please call your pharmacy*   Lab Work: None If you have labs (blood work) drawn today and your tests are completely normal, you will receive your results only by: [place] (if you  have MyChart) OR A paper copy in the mail If you have any lab test that is abnormal or we need to change your treatment, we will call you to review the results.   Testing/Procedures: Your physician has requested that you have an echocardiogram. Echocardiography is a painless test that uses sound waves to create images of your heart. It provides your doctor with information about the size and shape of your heart and how well your heart's chambers and valves are working. This procedure takes approximately one hour. There are no restrictions for this procedure.  Your physician recommends that you have a Calcium Score performed.   Follow-Up: At [facility], you and your health needs are our priority.  As part of our continuing mission to provide you with exceptional heart care, we have created designated Provider Care Teams.  These Care Teams include your primary Cardiologist (physician) and Advanced Practice Providers (APPs -  Physician Assistants and Nurse Practitioners) who all work together to provide you with the care you need, when you need it.  We recommend signing up for the patient portal called "MyChart".  Sign up information is provided on this After Visit Summary.  MyChart is used to connect with patients for Virtual Visits (Telemedicine).  Patients are able to view lab/test results, encounter notes, upcoming appointments, etc.  Non-urgent messages can be sent to your provider as well.   To learn more about what you can do with MyChart, go to [email or web address].    Your next appointment:   As needed   The format for your next appointment:   In Person  Provider:   You may see Daneen Schick, MD or one of the following Advanced Practice Providers on your designated Care Team:   Kathyrn Drown, NP   Other Instructions     Signed, Sinclair Grooms, MD  05/25/2021 1:22 PM    [place]

## 2021-05-25 ENCOUNTER — Ambulatory Visit (INDEPENDENT_AMBULATORY_CARE_PROVIDER_SITE_OTHER): Payer: 59 | Admitting: Interventional Cardiology

## 2021-05-25 ENCOUNTER — Other Ambulatory Visit: Payer: Self-pay

## 2021-05-25 ENCOUNTER — Encounter: Payer: Self-pay | Admitting: Interventional Cardiology

## 2021-05-25 VITALS — BP 116/82 | HR 90 | Ht 62.0 in | Wt 172.2 lb

## 2021-05-25 DIAGNOSIS — R0789 Other chest pain: Secondary | ICD-10-CM

## 2021-05-25 DIAGNOSIS — F329 Major depressive disorder, single episode, unspecified: Secondary | ICD-10-CM

## 2021-05-25 DIAGNOSIS — R002 Palpitations: Secondary | ICD-10-CM

## 2021-05-25 DIAGNOSIS — R7303 Prediabetes: Secondary | ICD-10-CM | POA: Diagnosis not present

## 2021-05-25 DIAGNOSIS — R0602 Shortness of breath: Secondary | ICD-10-CM | POA: Diagnosis not present

## 2021-05-25 DIAGNOSIS — F901 Attention-deficit hyperactivity disorder, predominantly hyperactive type: Secondary | ICD-10-CM

## 2021-05-25 DIAGNOSIS — E7800 Pure hypercholesterolemia, unspecified: Secondary | ICD-10-CM

## 2021-05-25 NOTE — Patient Instructions (Signed)
Medication Instructions:  Your physician recommends that you continue on your current medications as directed. Please refer to the Current Medication list given to you today.  *If you need a refill on your cardiac medications before your next appointment, please call your pharmacy*   Lab Work: None If you have labs (blood work) drawn today and your tests are completely normal, you will receive your results only by: [place] (if you have MyChart) OR A paper copy in the mail If you have any lab test that is abnormal or we need to change your treatment, we will call you to review the results.   Testing/Procedures: Your physician has requested that you have an echocardiogram. Echocardiography is a painless test that uses sound waves to create images of your heart. It provides your doctor with information about the size and shape of your heart and how well your heart's chambers and valves are working. This procedure takes approximately one hour. There are no restrictions for this procedure.  Your physician recommends that you have a Calcium Score performed.   Follow-Up: At [facility], you and your health needs are our priority.  As part of our continuing mission to provide you with exceptional heart care, we have created designated Provider Care Teams.  These Care Teams include your primary Cardiologist (physician) and Advanced Practice Providers (APPs -  Physician Assistants and Nurse Practitioners) who all work together to provide you with the care you need, when you need it.  We recommend signing up for the patient portal called "MyChart".  Sign up information is provided on this After Visit Summary.  MyChart is used to connect with patients for Virtual Visits (Telemedicine).  Patients are able to view lab/test results, encounter notes, upcoming appointments, etc.  Non-urgent messages can be sent to your provider as well.   To learn more about what you can do with MyChart, go to  [email or web address].    Your next appointment:   As needed   The format for your next appointment:   In Person  Provider:   You may see Daneen Schick, MD or one of the following Advanced Practice Providers on your designated Care Team:   Kathyrn Drown, NP   Other Instructions

## 2021-06-02 ENCOUNTER — Telehealth: Payer: Self-pay | Admitting: Family Medicine

## 2021-06-02 DIAGNOSIS — F901 Attention-deficit hyperactivity disorder, predominantly hyperactive type: Secondary | ICD-10-CM

## 2021-06-02 DIAGNOSIS — M797 Fibromyalgia: Secondary | ICD-10-CM

## 2021-06-02 MED ORDER — AMPHETAMINE-DEXTROAMPHETAMINE 30 MG PO TABS: 30.0000 mg | ORAL_TABLET | Freq: Two times a day (BID) | ORAL | 0 refills | Status: DC

## 2021-06-02 MED ORDER — CYCLOBENZAPRINE HCL 5 MG PO TABS: 5.0000 mg | ORAL_TABLET | Freq: Two times a day (BID) | ORAL | 3 refills | Status: DC | PRN

## 2021-06-02 NOTE — Telephone Encounter (Signed)
Pt called for refills of Adderall and Flexeril. [facility] [place]

## 2021-06-09 ENCOUNTER — Telehealth: Payer: Self-pay | Admitting: Family Medicine

## 2021-06-09 DIAGNOSIS — M797 Fibromyalgia: Secondary | ICD-10-CM

## 2021-06-09 NOTE — Telephone Encounter (Signed)
Pt is requesting refill on Sertraline 100mg , Flexeril 5mg  and Tramadol 50mg  [facility] [address]

## 2021-06-10 MED ORDER — SERTRALINE HCL 100 MG PO TABS: 100.0000 mg | ORAL_TABLET | Freq: Two times a day (BID) | ORAL | 0 refills | Status: DC

## 2021-06-10 MED ORDER — TRAMADOL HCL 50 MG PO TABS: 50.0000 mg | ORAL_TABLET | Freq: Four times a day (QID) | ORAL | 1 refills | Status: DC | PRN

## 2021-06-10 MED ORDER — CYCLOBENZAPRINE HCL 5 MG PO TABS: 5.0000 mg | ORAL_TABLET | Freq: Two times a day (BID) | ORAL | 3 refills | Status: DC | PRN

## 2021-06-12 ENCOUNTER — Other Ambulatory Visit (HOSPITAL_COMMUNITY): Payer: 59

## 2021-06-12 MED ORDER — SERTRALINE HCL 100 MG PO TABS: 100.0000 mg | ORAL_TABLET | Freq: Two times a day (BID) | ORAL | 0 refills | Status: DC

## 2021-06-12 NOTE — Addendum Note (Signed)
Addended by: Minette Headland A on: 06/12/2021 09:45 AM   Modules accepted: Orders

## 2021-06-20 ENCOUNTER — Encounter (HOSPITAL_COMMUNITY): Payer: Self-pay | Admitting: Interventional Cardiology

## 2021-06-22 ENCOUNTER — Inpatient Hospital Stay: Admission: RE | Admit: 2021-06-22 | Payer: Self-pay | Source: Ambulatory Visit

## 2021-07-04 ENCOUNTER — Telehealth (HOSPITAL_COMMUNITY): Payer: Self-pay | Admitting: Interventional Cardiology

## 2021-07-04 NOTE — Telephone Encounter (Signed)
Not dismissed.

## 2021-07-04 NOTE — Telephone Encounter (Signed)
Just an FYI. We have made several attempts to contact this patient including sending a letter to schedule or reschedule their echocardiogram. We will be removing the patient from the echo WQ.  06/12/21 NO SHOWED-MAILED LETTER [facility] 06/19/21      Thank you

## 2021-08-22 ENCOUNTER — Encounter: Payer: 59 | Admitting: Family Medicine

## 2021-10-08 ENCOUNTER — Other Ambulatory Visit: Payer: Self-pay | Admitting: Family Medicine

## 2021-10-08 DIAGNOSIS — M797 Fibromyalgia: Secondary | ICD-10-CM

## 2021-10-09 ENCOUNTER — Other Ambulatory Visit: Payer: Self-pay | Admitting: Family Medicine

## 2021-10-09 DIAGNOSIS — M797 Fibromyalgia: Secondary | ICD-10-CM

## 2021-10-09 NOTE — Telephone Encounter (Signed)
[name] is requesting to fill pt tramadol. Please advise [facility]

## 2021-10-09 NOTE — Telephone Encounter (Signed)
[facility] and ambien. Please advise [facility]

## 2021-10-27 ENCOUNTER — Telehealth: Payer: Self-pay | Admitting: Family Medicine

## 2021-10-27 DIAGNOSIS — F901 Attention-deficit hyperactivity disorder, predominantly hyperactive type: Secondary | ICD-10-CM

## 2021-10-27 MED ORDER — AMPHETAMINE-DEXTROAMPHETAMINE 30 MG PO TABS
30.0000 mg | ORAL_TABLET | Freq: Two times a day (BID) | ORAL | 0 refills | Status: DC
Start: 2021-11-27 — End: 2021-12-29

## 2021-10-27 MED ORDER — AMPHETAMINE-DEXTROAMPHETAMINE 30 MG PO TABS
30.0000 mg | ORAL_TABLET | Freq: Two times a day (BID) | ORAL | 0 refills | Status: DC
Start: 2021-12-28 — End: 2021-12-29

## 2021-10-27 MED ORDER — AMPHETAMINE-DEXTROAMPHETAMINE 30 MG PO TABS
30.0000 mg | ORAL_TABLET | Freq: Two times a day (BID) | ORAL | 0 refills | Status: DC
Start: 2021-10-27 — End: 2022-02-27

## 2021-10-27 NOTE — Telephone Encounter (Signed)
[facility]. Pt called for refills on adderall 30 mg. [facility] [address]. Pt can be reached at [phone]

## 2021-12-12 ENCOUNTER — Other Ambulatory Visit: Payer: Self-pay | Admitting: Family Medicine

## 2021-12-12 DIAGNOSIS — M797 Fibromyalgia: Secondary | ICD-10-CM

## 2021-12-13 NOTE — Telephone Encounter (Signed)
[name] zoloft. Please advise [facility]

## 2021-12-29 ENCOUNTER — Telehealth: Payer: Self-pay | Admitting: Family Medicine

## 2021-12-29 DIAGNOSIS — Z8619 Personal history of other infectious and parasitic diseases: Secondary | ICD-10-CM

## 2021-12-29 DIAGNOSIS — M797 Fibromyalgia: Secondary | ICD-10-CM

## 2021-12-29 DIAGNOSIS — F901 Attention-deficit hyperactivity disorder, predominantly hyperactive type: Secondary | ICD-10-CM

## 2021-12-29 MED ORDER — TRAMADOL HCL 50 MG PO TABS: 50.0000 mg | ORAL_TABLET | Freq: Four times a day (QID) | ORAL | 0 refills | Status: DC | PRN

## 2021-12-29 MED ORDER — AMPHETAMINE-DEXTROAMPHETAMINE 30 MG PO TABS: 30.0000 mg | ORAL_TABLET | Freq: Two times a day (BID) | ORAL | 0 refills | Status: DC

## 2021-12-29 MED ORDER — VALACYCLOVIR HCL 1 G PO TABS: ORAL_TABLET | ORAL | 5 refills | Status: DC

## 2021-12-29 MED ORDER — CYCLOBENZAPRINE HCL 5 MG PO TABS: 5.0000 mg | ORAL_TABLET | Freq: Two times a day (BID) | ORAL | 0 refills | Status: DC | PRN

## 2021-12-29 NOTE — Telephone Encounter (Signed)
Pt called and is requesting refills On her Tramadol Flexeril Adderrell Valtrex please send to the   [place] ([place] [place] Pt number is [phone]

## 2022-01-04 ENCOUNTER — Other Ambulatory Visit: Payer: Self-pay | Admitting: Family Medicine

## 2022-01-04 DIAGNOSIS — M797 Fibromyalgia: Secondary | ICD-10-CM

## 2022-01-04 NOTE — Telephone Encounter (Signed)
Got this again from [place]. It was filled six days ago and pt has appt 01/17/22. Please refuse and you can address this at her next appointment. [place]

## 2022-01-17 ENCOUNTER — Other Ambulatory Visit: Payer: Self-pay

## 2022-01-17 ENCOUNTER — Ambulatory Visit (INDEPENDENT_AMBULATORY_CARE_PROVIDER_SITE_OTHER): Payer: 59 | Admitting: Family Medicine

## 2022-01-17 VITALS — BP 110/70 | HR 97 | Temp 99.0°F | Wt 165.4 lb

## 2022-01-17 DIAGNOSIS — Z23 Encounter for immunization: Secondary | ICD-10-CM

## 2022-01-17 DIAGNOSIS — F329 Major depressive disorder, single episode, unspecified: Secondary | ICD-10-CM

## 2022-01-17 DIAGNOSIS — Z79899 Other long term (current) drug therapy: Secondary | ICD-10-CM | POA: Diagnosis not present

## 2022-01-17 DIAGNOSIS — E7439 Other disorders of intestinal carbohydrate absorption: Secondary | ICD-10-CM

## 2022-01-17 DIAGNOSIS — G43009 Migraine without aura, not intractable, without status migrainosus: Secondary | ICD-10-CM | POA: Diagnosis not present

## 2022-01-17 DIAGNOSIS — M797 Fibromyalgia: Secondary | ICD-10-CM | POA: Diagnosis not present

## 2022-01-17 DIAGNOSIS — F901 Attention-deficit hyperactivity disorder, predominantly hyperactive type: Secondary | ICD-10-CM

## 2022-01-17 LAB — LIPID PANEL

## 2022-01-17 LAB — POCT GLYCOSYLATED HEMOGLOBIN (HGB A1C): Hemoglobin A1C: 6.2 % — AB (ref 4.0–5.6)

## 2022-01-17 MED ORDER — SERTRALINE HCL 100 MG PO TABS: 100.0000 mg | ORAL_TABLET | Freq: Two times a day (BID) | ORAL | 3 refills | Status: DC

## 2022-01-17 MED ORDER — CYCLOBENZAPRINE HCL 5 MG PO TABS: 5.0000 mg | ORAL_TABLET | Freq: Two times a day (BID) | ORAL | 3 refills | Status: DC | PRN

## 2022-01-17 NOTE — Progress Notes (Addendum)
° °  Subjective:    Patient ID: [name], female    DOB: 05-Nov-1966, 56 y.o.   MRN: [id number]  HPI She is here for medication check appointment.  She does have a several day history of nasal congestion, coughing, postnasal drainage but no fever or chills.  She gets regular Pap and mammogram done by her gynecologist.  She does complain of a burning sensation to the lateral aspect of the left foot in the setting going on for 2 months.  It does not occur in any other areas.  She has not fallen, had any weakness numbness or tingling.  She does have an underlying history of fibromyalgia and is quite stable on her present medication regimen.  She does have a history of migraine headaches but since she has changed jobs, she has had very little difficulty with these.  She has tried triptans in the past but found them not very useful.  Tramadol seems to do the best for this.  She does have a history of glucose intolerance.  She does have ADD and seems to be doing quite nicely on her present medication regimen.  1 pill lasts her 8-hour or so sometimes she does not take the second dose.  Psychologically she seems to be very stable with her present situation and is comfortable with the dosing that she is on.   Review of Systems     Objective:   Physical Exam Alert and in no distress. Tympanic membranes and canals are normal. Pharyngeal area is normal. Neck is supple without adenopathy or thyromegaly. Cardiac exam shows a regular sinus rhythm without murmurs or gallops. Lungs are clear to auscultation. Hemoglobin A1c is 6.2       Assessment & Plan:  Fibromyalgia muscle pain - Plan: cyclobenzaprine (FLEXERIL) 5 MG tablet, sertraline (ZOLOFT) 100 MG tablet  Migraine without aura and without status migrainosus, not intractable  Attention deficit hyperactivity disorder (ADHD), predominantly hyperactive type  Glucose intolerance - Plan: POCT glycosylated hemoglobin (Hb A1C), CBC with  Differential/Platelet, Comprehensive metabolic panel, Lipid panel  Reactive depression  Encounter for long-term (current) use of medications - Plan: POCT glycosylated hemoglobin (Hb A1C), CBC with Differential/Platelet, Comprehensive metabolic panel, Lipid panel  Need for influenza vaccination - Plan: Flu Vaccine QUAD 27mo+IM (Fluarix, Fluzone & Alfiuria Quad PF)  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  Recommend OTC meds for her URI.  Discussed diet and exercise with her as a mainstay for taking care of the glucose intolerance.  Her immunizations were updated.

## 2022-01-18 LAB — CBC WITH DIFFERENTIAL/PLATELET
Basophils Absolute: 0 10*3/uL (ref 0.0–0.2)
Basos: 1 %
EOS (ABSOLUTE): 0.2 10*3/uL (ref 0.0–0.4)
Eos: 4 %
Hematocrit: 38.6 % (ref 34.0–46.6)
Hemoglobin: 12.1 g/dL (ref 11.1–15.9)
Immature Grans (Abs): 0 10*3/uL (ref 0.0–0.1)
Immature Granulocytes: 0 %
Lymphocytes Absolute: 1.4 10*3/uL (ref 0.7–3.1)
Lymphs: 34 %
MCH: 26.4 pg — ABNORMAL LOW (ref 26.6–33.0)
MCHC: 31.3 g/dL — ABNORMAL LOW (ref 31.5–35.7)
MCV: 84 fL (ref 79–97)
Monocytes Absolute: 0.4 10*3/uL (ref 0.1–0.9)
Monocytes: 9 %
Neutrophils Absolute: 2.2 10*3/uL (ref 1.4–7.0)
Neutrophils: 52 %
Platelets: 253 10*3/uL (ref 150–450)
RBC: 4.59 x10E6/uL (ref 3.77–5.28)
RDW: 13.2 % (ref 11.7–15.4)
WBC: 4.1 10*3/uL (ref 3.4–10.8)

## 2022-01-18 LAB — COMPREHENSIVE METABOLIC PANEL
ALT: 11 IU/L (ref 0–32)
AST: 21 IU/L (ref 0–40)
Albumin/Globulin Ratio: 1.8 (ref 1.2–2.2)
Albumin: 4.3 g/dL (ref 3.8–4.9)
Alkaline Phosphatase: 114 IU/L (ref 44–121)
BUN/Creatinine Ratio: 19 (ref 9–23)
BUN: 13 mg/dL (ref 6–24)
Bilirubin Total: <0.2 mg/dL (ref 0.0–1.2)
CO2: 21 mmol/L (ref 20–29)
Calcium: 9.2 mg/dL (ref 8.7–10.2)
Chloride: 106 mmol/L (ref 96–106)
Creatinine, Ser: 0.7 mg/dL (ref 0.57–1.00)
Globulin, Total: 2.4 g/dL (ref 1.5–4.5)
Glucose: 99 mg/dL (ref 70–99)
Potassium: 4.9 mmol/L (ref 3.5–5.2)
Sodium: 145 mmol/L — ABNORMAL HIGH (ref 134–144)
Total Protein: 6.7 g/dL (ref 6.0–8.5)
eGFR: 101 mL/min/{1.73_m2} (ref >59)

## 2022-01-18 LAB — LIPID PANEL
Chol/HDL Ratio: 3.7 ratio (ref 0.0–4.4)
Cholesterol, Total: 221 mg/dL — ABNORMAL HIGH (ref 100–199)
HDL: 59 mg/dL (ref >39)
LDL Chol Calc (NIH): 153 mg/dL — ABNORMAL HIGH (ref 0–99)
Triglycerides: 51 mg/dL (ref 0–149)
VLDL Cholesterol Cal: 9 mg/dL (ref 5–40)

## 2022-02-19 ENCOUNTER — Telehealth: Payer: Self-pay

## 2022-02-19 DIAGNOSIS — M797 Fibromyalgia: Secondary | ICD-10-CM

## 2022-02-19 MED ORDER — TRAMADOL HCL 50 MG PO TABS: 50.0000 mg | ORAL_TABLET | Freq: Four times a day (QID) | ORAL | 2 refills | Status: DC | PRN

## 2022-02-19 NOTE — Telephone Encounter (Signed)
Received fax from [facility] for a refill on Tramadol last apt was 01/17/22 next apt 02/27/22. ?

## 2022-02-27 ENCOUNTER — Ambulatory Visit (INDEPENDENT_AMBULATORY_CARE_PROVIDER_SITE_OTHER): Payer: 59 | Admitting: Family Medicine

## 2022-02-27 ENCOUNTER — Encounter: Payer: Self-pay | Admitting: Family Medicine

## 2022-02-27 VITALS — BP 102/70 | HR 92 | Temp 98.2°F | Ht 60.5 in | Wt 161.8 lb

## 2022-02-27 DIAGNOSIS — Z79899 Other long term (current) drug therapy: Secondary | ICD-10-CM

## 2022-02-27 DIAGNOSIS — J069 Acute upper respiratory infection, unspecified: Secondary | ICD-10-CM

## 2022-02-27 DIAGNOSIS — M797 Fibromyalgia: Secondary | ICD-10-CM

## 2022-02-27 DIAGNOSIS — F901 Attention-deficit hyperactivity disorder, predominantly hyperactive type: Secondary | ICD-10-CM

## 2022-02-27 DIAGNOSIS — G43009 Migraine without aura, not intractable, without status migrainosus: Secondary | ICD-10-CM

## 2022-02-27 DIAGNOSIS — Z0000 Encounter for general adult medical examination without abnormal findings: Secondary | ICD-10-CM | POA: Diagnosis not present

## 2022-02-27 DIAGNOSIS — F329 Major depressive disorder, single episode, unspecified: Secondary | ICD-10-CM

## 2022-02-27 DIAGNOSIS — E7439 Other disorders of intestinal carbohydrate absorption: Secondary | ICD-10-CM

## 2022-02-27 DIAGNOSIS — E785 Hyperlipidemia, unspecified: Secondary | ICD-10-CM

## 2022-02-27 MED ORDER — AMPHETAMINE-DEXTROAMPHETAMINE 30 MG PO TABS: 30.0000 mg | ORAL_TABLET | Freq: Two times a day (BID) | ORAL | 0 refills | Status: DC

## 2022-02-27 NOTE — Patient Instructions (Addendum)
You can take 2 Tylenol 4 times per day as needed for pain if you need extra relief ?If you are not better towards the end of the week call me and also check on the Adderall ?

## 2022-02-27 NOTE — Progress Notes (Signed)
? ?  Subjective:  ? ? Patient ID: [name], female    DOB: October 21, 1966, 56 y.o.   MRN: [id number] ? ?HPI ?She is here for complete examination.  Presently she is having some URI symptoms with chest congestion and a slightly productive cough but no fever, chills, nasal congestion or sore throat.  She continues have difficulty with fibromyalgia and seems to be doing fairly well on her present medication regimen of Flexeril, Ambien, tramadol.  She is also using Zoloft with good response.  She also has a previous history of glucose intolerance as well as hyperlipidemia.  She does have history of migraine headaches but has not had one in several months.  She does have an aura and usually will take Imitrex to abort the headache.  Her ADHD seems to be controlled on her present medication regimen although it is difficult to get due to the nationwide shortage.  Otherwise she has no particular concerns or complaints.  Family and social history as well as health maintenance and immunizations was reviewed. ? ? ?Review of Systems  ?All other systems reviewed and are negative. ? ?   ?Objective:  ? Physical Exam ?Alert and in no distress. Tympanic membranes and canals are normal. Pharyngeal area is normal. Neck is supple without adenopathy or thyromegaly. Cardiac exam shows a regular sinus rhythm without murmurs or gallops. Lungs are clear to auscultation. ? ? ? ? ?   ?Assessment & Plan:  ?Routine general medical examination at a health care facility ? ?Fibromyalgia muscle pain ? ?Migraine without aura and without status migrainosus, not intractable ? ?Attention deficit hyperactivity disorder (ADHD), predominantly hyperactive type ? ?Glucose intolerance ? ?Reactive depression ? ?Encounter for long-term (current) use of medications ? ?Hyperlipidemia, unspecified hyperlipidemia type ? ?Viral upper respiratory tract infection ? ?Attention-deficit hyperactivity disorder, predominantly hyperactive type - Plan:  amphetamine-dextroamphetamine (ADDERALL) 30 MG tablet ?Continue on her present medication regimen.  Recheck here in about 6 months.  She will call if continued difficulty with her URI symptoms.  Also recommend adding Tylenol to her pain regimen to see if that can help mitigate some of the fibromyalgia pain. ? ?

## 2022-03-07 IMAGING — MG MM DIGITAL SCREENING BILAT W/ TOMO AND CAD
6 of 10 series · 6 of 30 positions shown · non-contrast
Comparison: Previous exam(s).

CLINICAL DATA: Screening.

EXAM:
DIGITAL SCREENING BILATERAL MAMMOGRAM WITH TOMOSYNTHESIS AND CAD
TECHNIQUE: Bilateral screening digital craniocaudal and mediolateral oblique
mammograms were obtained. Bilateral screening digital breast
tomosynthesis was performed. The images were evaluated with
computer-aided detection.

[R MLO synth-2D (1 of 2)]
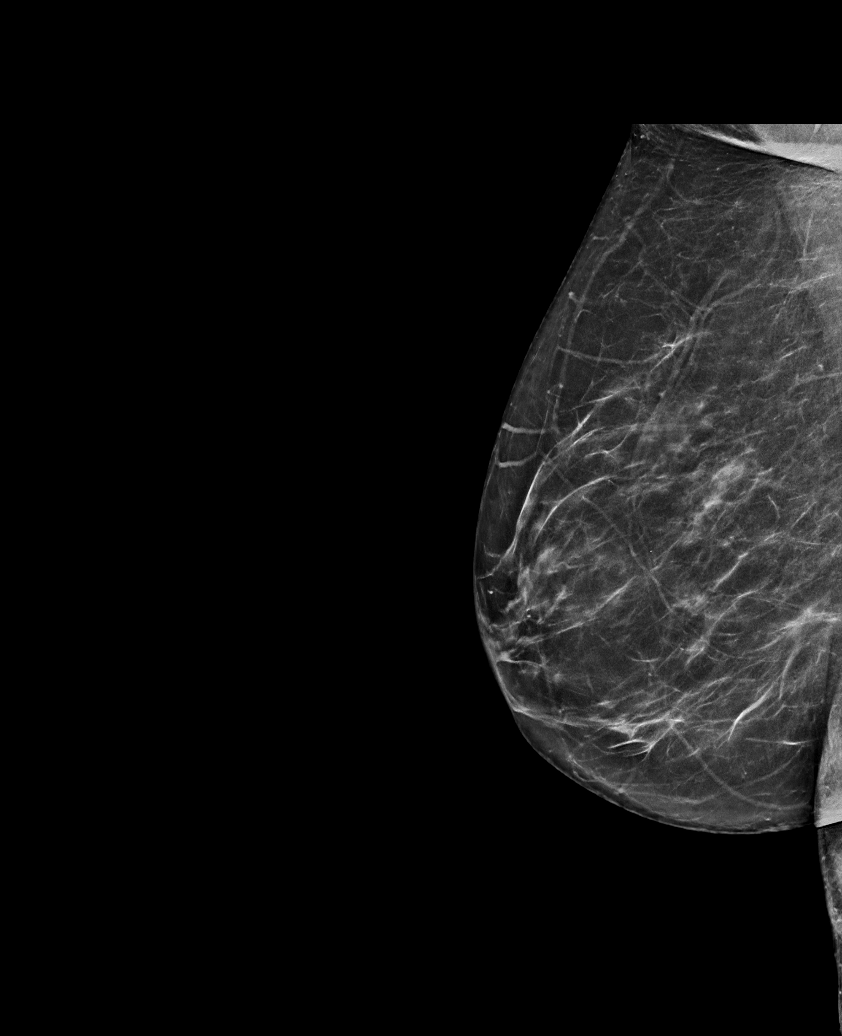

[R MLO synth-2D (2 of 2)]
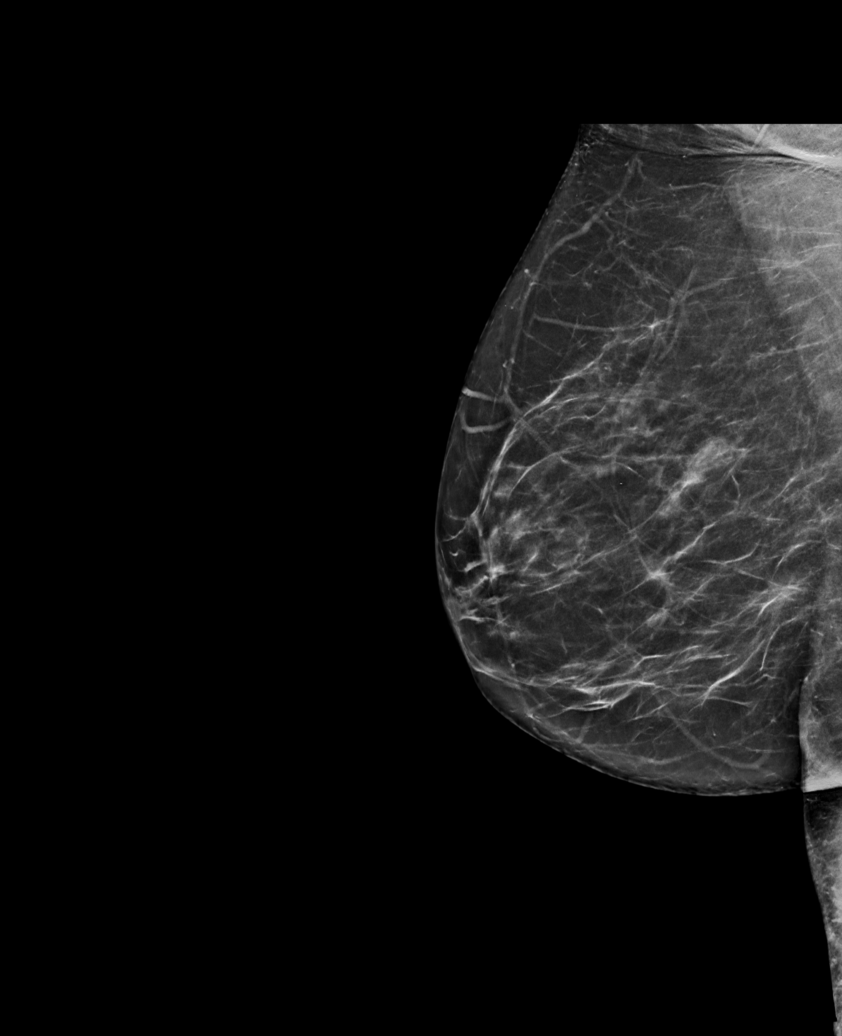

[R CC synth-2D]
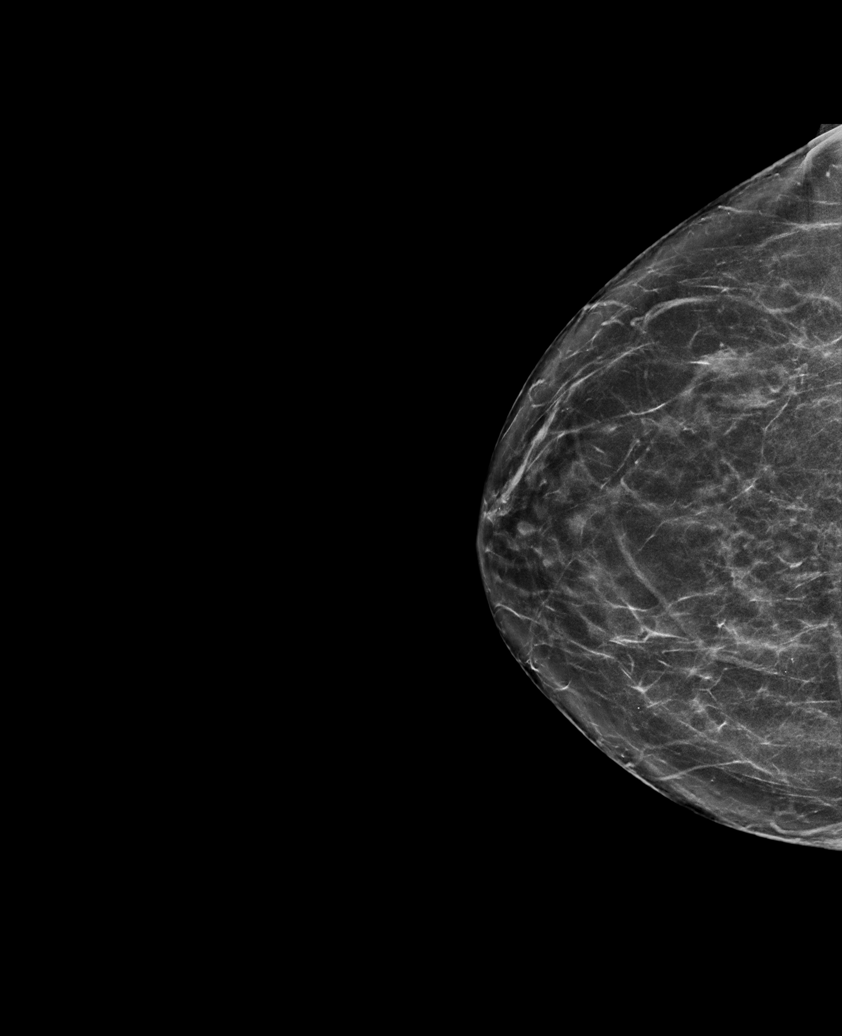

[L MLO synth-2D]
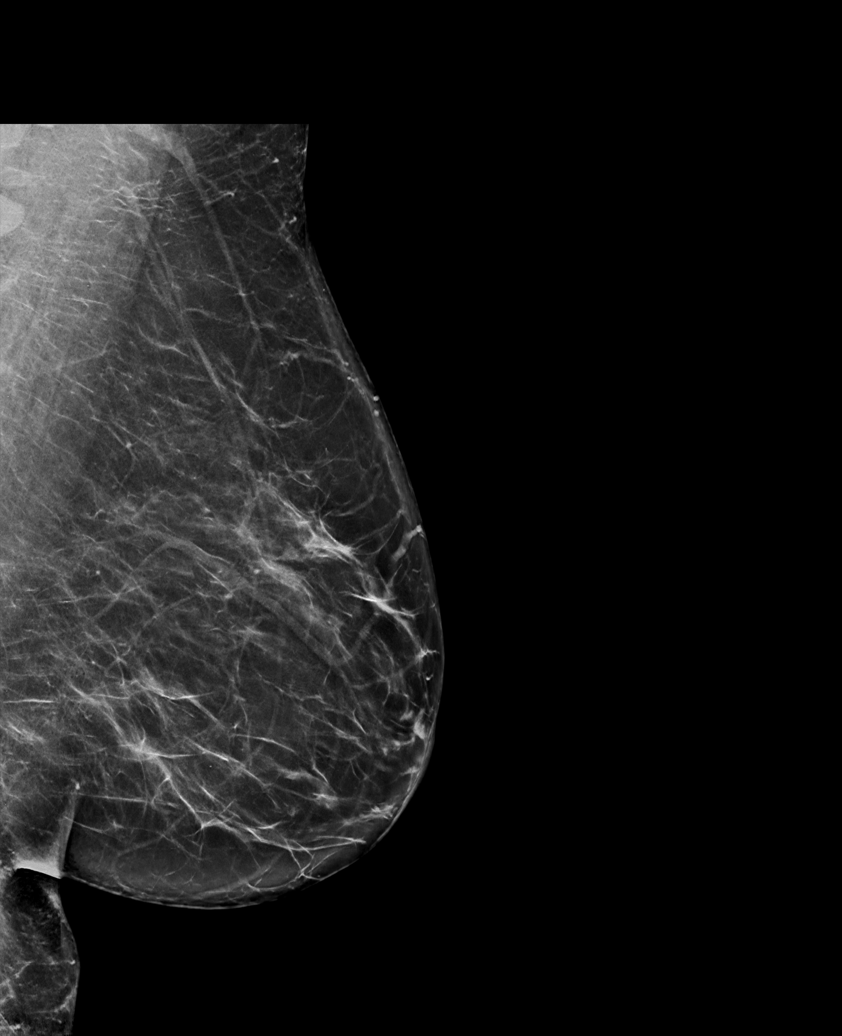

[L CC synth-2D]
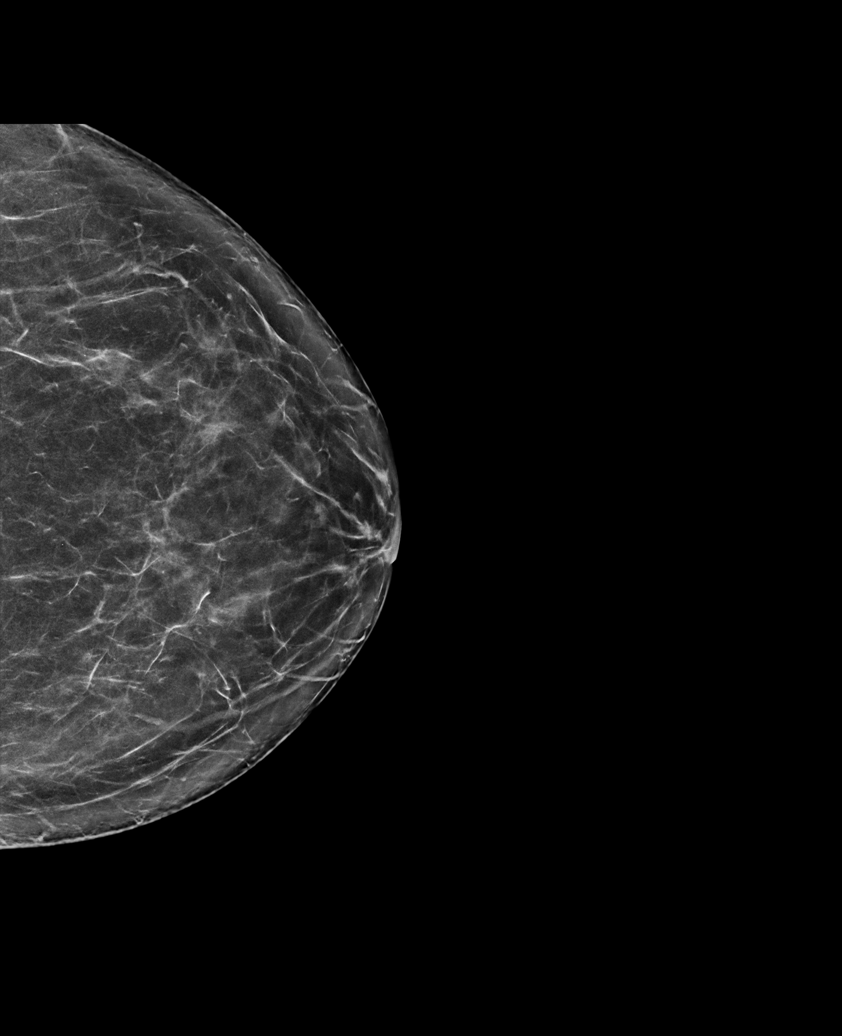

[L MLO tomo · tomo slice 41/82.0]
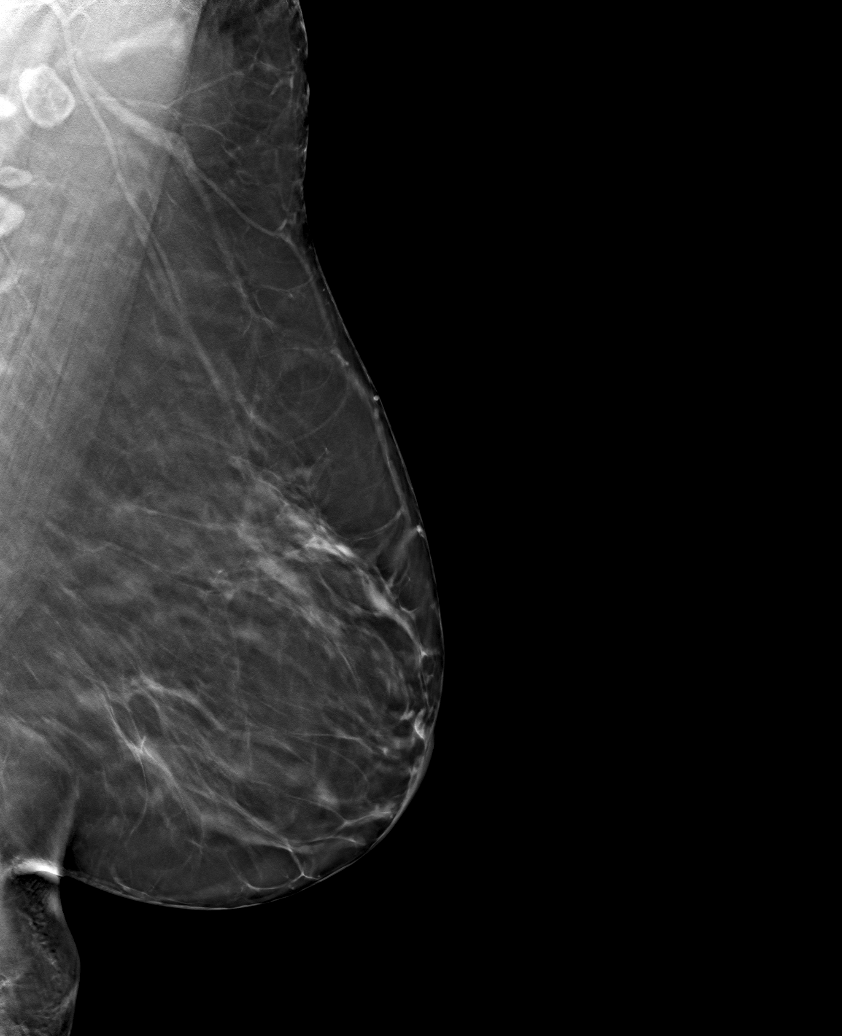

[6 of 30 positions shown; findings below may reference images not displayed]

ACR Breast Density Category c: The breast tissue is heterogeneously
dense, which may obscure small masses.
FINDINGS: There are no findings suspicious for malignancy. The images were
evaluated with computer-aided detection.
IMPRESSION: No mammographic evidence of malignancy. A result letter of this
screening mammogram will be mailed directly to the patient.

RECOMMENDATION:
Screening mammogram in one year. (Code:T4-5-GWO)

BI-RADS CATEGORY  1: Negative.

## 2022-04-11 ENCOUNTER — Telehealth: Payer: Self-pay

## 2022-04-11 DIAGNOSIS — M797 Fibromyalgia: Secondary | ICD-10-CM

## 2022-04-11 DIAGNOSIS — F901 Attention-deficit hyperactivity disorder, predominantly hyperactive type: Secondary | ICD-10-CM

## 2022-04-11 MED ORDER — AMPHETAMINE-DEXTROAMPHETAMINE 30 MG PO TABS: 30.0000 mg | ORAL_TABLET | Freq: Two times a day (BID) | ORAL | 0 refills | Status: DC

## 2022-04-11 MED ORDER — SERTRALINE HCL 100 MG PO TABS: 100.0000 mg | ORAL_TABLET | Freq: Two times a day (BID) | ORAL | 3 refills | Status: DC

## 2022-04-11 MED ORDER — ZOLPIDEM TARTRATE ER 12.5 MG PO TBCR: 12.5000 mg | EXTENDED_RELEASE_TABLET | Freq: Every day | ORAL | 1 refills | Status: DC

## 2022-04-11 NOTE — Telephone Encounter (Signed)
Pt. Called stating she needs a refill on her Adderall, Ambein, [facility] [address] Last apt was 02/27/22 next apt is 07/02/22. ?

## 2022-05-21 ENCOUNTER — Other Ambulatory Visit: Payer: Self-pay | Admitting: Family Medicine

## 2022-05-21 DIAGNOSIS — M797 Fibromyalgia: Secondary | ICD-10-CM

## 2022-07-02 ENCOUNTER — Ambulatory Visit: Payer: 59 | Admitting: Family Medicine

## 2022-08-01 ENCOUNTER — Encounter: Payer: Self-pay | Admitting: Internal Medicine

## 2022-09-04 ENCOUNTER — Encounter: Payer: Self-pay | Admitting: Internal Medicine

## 2022-09-17 ENCOUNTER — Encounter: Payer: Self-pay | Admitting: Internal Medicine

## 2022-10-12 ENCOUNTER — Other Ambulatory Visit: Payer: Self-pay | Admitting: Family Medicine

## 2022-10-12 DIAGNOSIS — M797 Fibromyalgia: Secondary | ICD-10-CM

## 2022-10-15 NOTE — Telephone Encounter (Signed)
Is this okay to refill? 

## 2022-11-15 ENCOUNTER — Telehealth: Payer: Self-pay | Admitting: Family Medicine

## 2022-11-15 DIAGNOSIS — F901 Attention-deficit hyperactivity disorder, predominantly hyperactive type: Secondary | ICD-10-CM

## 2022-11-15 MED ORDER — AMPHETAMINE-DEXTROAMPHETAMINE 30 MG PO TABS: 30.0000 mg | ORAL_TABLET | Freq: Two times a day (BID) | ORAL | 0 refills | Status: DC

## 2022-11-15 NOTE — Telephone Encounter (Signed)
Pt called for refills of adderall. [facility] [address]

## 2023-01-05 ENCOUNTER — Other Ambulatory Visit: Payer: Self-pay | Admitting: Family Medicine

## 2023-01-05 DIAGNOSIS — M797 Fibromyalgia: Secondary | ICD-10-CM

## 2023-01-07 ENCOUNTER — Telehealth: Payer: Self-pay | Admitting: Family Medicine

## 2023-01-07 DIAGNOSIS — M797 Fibromyalgia: Secondary | ICD-10-CM

## 2023-01-07 DIAGNOSIS — Z8619 Personal history of other infectious and parasitic diseases: Secondary | ICD-10-CM

## 2023-01-07 DIAGNOSIS — F410 Panic disorder [episodic paroxysmal anxiety] without agoraphobia: Secondary | ICD-10-CM

## 2023-01-07 MED ORDER — ALPRAZOLAM 0.25 MG PO TABS: 0.2500 mg | ORAL_TABLET | Freq: Two times a day (BID) | ORAL | 0 refills | Status: DC | PRN

## 2023-01-07 MED ORDER — TRAMADOL HCL 50 MG PO TABS: 50.0000 mg | ORAL_TABLET | Freq: Four times a day (QID) | ORAL | 1 refills | Status: DC | PRN

## 2023-01-07 MED ORDER — VALACYCLOVIR HCL 1 G PO TABS: ORAL_TABLET | ORAL | 5 refills | Status: DC

## 2023-01-07 NOTE — Telephone Encounter (Signed)
Pt called for refills of xanax, valtrex and tramadol. Please send to [facility]

## 2023-03-11 ENCOUNTER — Encounter: Payer: 59 | Admitting: Family Medicine

## 2023-03-18 ENCOUNTER — Encounter: Payer: 59 | Admitting: Family Medicine

## 2023-03-18 DIAGNOSIS — Z23 Encounter for immunization: Secondary | ICD-10-CM

## 2023-03-18 DIAGNOSIS — E785 Hyperlipidemia, unspecified: Secondary | ICD-10-CM

## 2023-03-18 DIAGNOSIS — M797 Fibromyalgia: Secondary | ICD-10-CM

## 2023-03-18 DIAGNOSIS — F901 Attention-deficit hyperactivity disorder, predominantly hyperactive type: Secondary | ICD-10-CM

## 2023-03-18 DIAGNOSIS — Z79899 Other long term (current) drug therapy: Secondary | ICD-10-CM

## 2023-03-18 DIAGNOSIS — Z1231 Encounter for screening mammogram for malignant neoplasm of breast: Secondary | ICD-10-CM

## 2023-03-18 DIAGNOSIS — E7439 Other disorders of intestinal carbohydrate absorption: Secondary | ICD-10-CM

## 2023-03-18 DIAGNOSIS — F329 Major depressive disorder, single episode, unspecified: Secondary | ICD-10-CM

## 2023-03-18 DIAGNOSIS — G43009 Migraine without aura, not intractable, without status migrainosus: Secondary | ICD-10-CM

## 2023-03-18 DIAGNOSIS — Z0000 Encounter for general adult medical examination without abnormal findings: Secondary | ICD-10-CM

## 2023-03-22 ENCOUNTER — Telehealth: Payer: Self-pay | Admitting: Family Medicine

## 2023-03-22 NOTE — Telephone Encounter (Signed)
Pt left message [facility]  

## 2023-03-26 NOTE — Telephone Encounter (Signed)
Appointment scheduled for 04/03/2023 at 4pm.

## 2023-04-03 ENCOUNTER — Ambulatory Visit (INDEPENDENT_AMBULATORY_CARE_PROVIDER_SITE_OTHER): Payer: Self-pay | Admitting: Family Medicine

## 2023-04-03 ENCOUNTER — Encounter: Payer: Self-pay | Admitting: Family Medicine

## 2023-04-03 VITALS — BP 124/82 | HR 82 | Temp 98.3°F | Resp 16 | Ht 61.0 in | Wt 169.4 lb

## 2023-04-03 DIAGNOSIS — F329 Major depressive disorder, single episode, unspecified: Secondary | ICD-10-CM

## 2023-04-03 DIAGNOSIS — M797 Fibromyalgia: Secondary | ICD-10-CM

## 2023-04-03 DIAGNOSIS — F901 Attention-deficit hyperactivity disorder, predominantly hyperactive type: Secondary | ICD-10-CM

## 2023-04-03 MED ORDER — ZOLPIDEM TARTRATE ER 12.5 MG PO TBCR: 12.5000 mg | EXTENDED_RELEASE_TABLET | Freq: Every day | ORAL | 1 refills | Status: DC

## 2023-04-03 MED ORDER — SERTRALINE HCL 100 MG PO TABS: 100.0000 mg | ORAL_TABLET | Freq: Two times a day (BID) | ORAL | 3 refills | Status: DC

## 2023-04-03 MED ORDER — CYCLOBENZAPRINE HCL 5 MG PO TABS: 5.0000 mg | ORAL_TABLET | Freq: Two times a day (BID) | ORAL | 3 refills | Status: DC | PRN

## 2023-04-03 MED ORDER — AMPHETAMINE-DEXTROAMPHETAMINE 30 MG PO TABS: 30.0000 mg | ORAL_TABLET | Freq: Two times a day (BID) | ORAL | 0 refills | Status: DC

## 2023-04-03 MED ORDER — TRAMADOL HCL 50 MG PO TABS
50.0000 mg | ORAL_TABLET | Freq: Four times a day (QID) | ORAL | 1 refills | Status: DC | PRN
Start: 2023-04-03 — End: 2023-11-01

## 2023-04-03 NOTE — Progress Notes (Signed)
   Subjective:    Patient ID: [name], female    DOB: [birth year], 57 y.o.   MRN: [id number]  HPI She is here for a med check.  She is in between insurance coverage.  She continues on Adderall and is getting good results with that.  She does have fibromyalgia and continues to do well on her present medication regimen and does need refills on some of her medications.  She states that she is no longer having any trouble with migraines.   Review of Systems     Objective:   Physical Exam Alert and in no distress. Tympanic membranes and canals are normal. Pharyngeal area is normal. Neck is supple without adenopathy or thyromegaly. Cardiac exam shows a regular sinus rhythm without murmurs or gallops. Lungs are clear to auscultation.        Assessment & Plan:  Attention-deficit hyperactivity disorder, predominantly hyperactive type - Plan: amphetamine-dextroamphetamine (ADDERALL) 30 MG tablet  Fibromyalgia muscle pain - Plan: cyclobenzaprine (FLEXERIL) 5 MG tablet, sertraline (ZOLOFT) 100 MG tablet, traMADol (ULTRAM) 50 MG tablet, zolpidem (AMBIEN CR) 12.5 MG CR tablet  Reactive depression Her medications were renewed and when she gets insurance she will return here for a more thorough exam and blood work.  She was comfortable with that.

## 2023-04-15 ENCOUNTER — Other Ambulatory Visit: Payer: Self-pay | Admitting: Family Medicine

## 2023-04-15 DIAGNOSIS — M797 Fibromyalgia: Secondary | ICD-10-CM

## 2023-05-02 ENCOUNTER — Telehealth: Payer: Self-pay | Admitting: Family Medicine

## 2023-05-02 NOTE — Telephone Encounter (Signed)
Patient advised. She is going to call around to different pharmacies and let us know who has it in stock.

## 2023-05-02 NOTE — Telephone Encounter (Signed)
Pharmacy sent message that zolpidem er was on back order  Please advise

## 2023-05-06 ENCOUNTER — Telehealth: Payer: Self-pay | Admitting: Family Medicine

## 2023-05-06 DIAGNOSIS — M797 Fibromyalgia: Secondary | ICD-10-CM

## 2023-05-06 NOTE — Telephone Encounter (Signed)
Pt left a message that she called around & [facility] on [address] has the Zolpidem 10mg  #90

## 2023-05-07 MED ORDER — ZOLPIDEM TARTRATE ER 12.5 MG PO TBCR
12.5000 mg | EXTENDED_RELEASE_TABLET | Freq: Every day | ORAL | 1 refills | Status: DC
Start: 2023-05-07 — End: 2023-11-01

## 2023-05-27 DIAGNOSIS — Z419 Encounter for procedure for purposes other than remedying health state, unspecified: Secondary | ICD-10-CM | POA: Diagnosis not present

## 2023-05-29 ENCOUNTER — Encounter: Payer: Self-pay | Admitting: Internal Medicine

## 2023-06-12 ENCOUNTER — Telehealth: Payer: Self-pay | Admitting: Family Medicine

## 2023-06-12 DIAGNOSIS — F901 Attention-deficit hyperactivity disorder, predominantly hyperactive type: Secondary | ICD-10-CM

## 2023-06-12 MED ORDER — AMPHETAMINE-DEXTROAMPHETAMINE 30 MG PO TABS: 30.0000 mg | ORAL_TABLET | Freq: Two times a day (BID) | ORAL | 0 refills | Status: DC

## 2023-06-12 NOTE — Telephone Encounter (Signed)
Prescription Request  06/12/2023  LOV: 04/03/2023  What is the name of the medication or equipment? Adderall   Have you contacted your pharmacy to request a refill? No   Which pharmacy would you like this sent to?  [id number] [place], [place] - [address] [place] [address] Phone: [phone] Fax: [phone]    Patient notified that their request is being sent to the clinical staff for review and that they should receive a response within 2 business days.   Please advise at Mobile [phone] (mobile)

## 2023-06-27 DIAGNOSIS — Z419 Encounter for procedure for purposes other than remedying health state, unspecified: Secondary | ICD-10-CM | POA: Diagnosis not present

## 2023-07-16 ENCOUNTER — Encounter: Payer: Medicaid Other | Admitting: Family Medicine

## 2023-07-16 DIAGNOSIS — M797 Fibromyalgia: Secondary | ICD-10-CM

## 2023-07-16 DIAGNOSIS — E782 Mixed hyperlipidemia: Secondary | ICD-10-CM

## 2023-07-16 DIAGNOSIS — E7439 Other disorders of intestinal carbohydrate absorption: Secondary | ICD-10-CM

## 2023-07-16 DIAGNOSIS — G43009 Migraine without aura, not intractable, without status migrainosus: Secondary | ICD-10-CM

## 2023-07-16 DIAGNOSIS — F410 Panic disorder [episodic paroxysmal anxiety] without agoraphobia: Secondary | ICD-10-CM

## 2023-07-16 DIAGNOSIS — F901 Attention-deficit hyperactivity disorder, predominantly hyperactive type: Secondary | ICD-10-CM

## 2023-07-16 DIAGNOSIS — Z0000 Encounter for general adult medical examination without abnormal findings: Secondary | ICD-10-CM

## 2023-07-16 DIAGNOSIS — F329 Major depressive disorder, single episode, unspecified: Secondary | ICD-10-CM

## 2023-07-28 DIAGNOSIS — Z419 Encounter for procedure for purposes other than remedying health state, unspecified: Secondary | ICD-10-CM | POA: Diagnosis not present

## 2023-08-01 ENCOUNTER — Telehealth: Payer: Self-pay | Admitting: Family Medicine

## 2023-08-01 DIAGNOSIS — F901 Attention-deficit hyperactivity disorder, predominantly hyperactive type: Secondary | ICD-10-CM

## 2023-08-01 MED ORDER — AMPHETAMINE-DEXTROAMPHETAMINE 30 MG PO TABS
30.0000 mg | ORAL_TABLET | Freq: Two times a day (BID) | ORAL | 0 refills | Status: DC
Start: 2023-08-01 — End: 2023-10-20

## 2023-08-01 NOTE — Telephone Encounter (Signed)
Pt req refill Adderall

## 2023-08-27 DIAGNOSIS — Z419 Encounter for procedure for purposes other than remedying health state, unspecified: Secondary | ICD-10-CM | POA: Diagnosis not present

## 2023-09-27 DIAGNOSIS — Z419 Encounter for procedure for purposes other than remedying health state, unspecified: Secondary | ICD-10-CM | POA: Diagnosis not present

## 2023-10-18 ENCOUNTER — Telehealth: Payer: Self-pay | Admitting: Family Medicine

## 2023-10-18 DIAGNOSIS — F901 Attention-deficit hyperactivity disorder, predominantly hyperactive type: Secondary | ICD-10-CM

## 2023-10-18 NOTE — Telephone Encounter (Signed)
Pt requesting a [id number] [place], [place] - [address]

## 2023-10-20 MED ORDER — AMPHETAMINE-DEXTROAMPHETAMINE 30 MG PO TABS
30.0000 mg | ORAL_TABLET | Freq: Two times a day (BID) | ORAL | 0 refills | Status: DC
Start: 2023-10-20 — End: 2024-01-21

## 2023-10-27 DIAGNOSIS — Z419 Encounter for procedure for purposes other than remedying health state, unspecified: Secondary | ICD-10-CM | POA: Diagnosis not present

## 2023-10-30 ENCOUNTER — Other Ambulatory Visit: Payer: Self-pay | Admitting: Family Medicine

## 2023-10-30 DIAGNOSIS — M797 Fibromyalgia: Secondary | ICD-10-CM

## 2023-11-27 DIAGNOSIS — Z419 Encounter for procedure for purposes other than remedying health state, unspecified: Secondary | ICD-10-CM | POA: Diagnosis not present

## 2023-12-24 ENCOUNTER — Ambulatory Visit: Payer: Medicaid Other | Admitting: Family Medicine

## 2023-12-24 ENCOUNTER — Encounter: Payer: Self-pay | Admitting: Family Medicine

## 2023-12-24 VITALS — BP 100/60 | HR 87 | Ht 61.0 in | Wt 166.6 lb

## 2023-12-24 DIAGNOSIS — F410 Panic disorder [episodic paroxysmal anxiety] without agoraphobia: Secondary | ICD-10-CM

## 2023-12-24 DIAGNOSIS — Z8619 Personal history of other infectious and parasitic diseases: Secondary | ICD-10-CM | POA: Diagnosis not present

## 2023-12-24 DIAGNOSIS — Z23 Encounter for immunization: Secondary | ICD-10-CM | POA: Diagnosis not present

## 2023-12-24 DIAGNOSIS — R3915 Urgency of urination: Secondary | ICD-10-CM | POA: Diagnosis not present

## 2023-12-24 DIAGNOSIS — M797 Fibromyalgia: Secondary | ICD-10-CM

## 2023-12-24 DIAGNOSIS — N3000 Acute cystitis without hematuria: Secondary | ICD-10-CM

## 2023-12-24 LAB — POCT URINALYSIS DIP (PROADVANTAGE DEVICE)
Bilirubin, UA: NEGATIVE
Glucose, UA: NEGATIVE mg/dL
Ketones, POC UA: NEGATIVE mg/dL
Nitrite, UA: NEGATIVE
Protein Ur, POC: NEGATIVE mg/dL
Specific Gravity, Urine: 1.025
Urobilinogen, Ur: 0.2
pH, UA: 6 (ref 5.0–8.0)

## 2023-12-24 MED ORDER — ALPRAZOLAM 0.25 MG PO TABS
0.2500 mg | ORAL_TABLET | Freq: Two times a day (BID) | ORAL | 0 refills | Status: AC | PRN
Start: 2023-12-24 — End: ?

## 2023-12-24 MED ORDER — NITROFURANTOIN MONOHYD MACRO 100 MG PO CAPS: 100.0000 mg | ORAL_CAPSULE | Freq: Two times a day (BID) | ORAL | 0 refills | Status: AC

## 2023-12-24 MED ORDER — CYCLOBENZAPRINE HCL 5 MG PO TABS
5.0000 mg | ORAL_TABLET | Freq: Two times a day (BID) | ORAL | 0 refills | Status: DC | PRN
Start: 2023-12-24 — End: 2024-07-28

## 2023-12-24 MED ORDER — VALACYCLOVIR HCL 1 G PO TABS: ORAL_TABLET | ORAL | 5 refills | Status: DC

## 2023-12-24 NOTE — Progress Notes (Signed)
   Subjective:    Patient ID: ARVETTA ARAQUE, female    DOB: 13-Jan-1966, 58 y.o.   MRN: [id number]  HPI She complains of a 1 month history of frequency and urgency but no fever chills, abdominal pain.  During that timeframe she was on an antibiotic for a dental problem but it did not clear up the urinary symptoms.  She also needs refills on Xanax that she uses periodically.  She also like a refill on her Valtrex that again is for herpetic infection.  She also needs Flexeril renewed.   Review of Systems     Objective:    Physical Exam Alert and in no distress.  Microscopic did show bacteria and white cells and the rare red cell.       Assessment & Plan:  Need for COVID-19 vaccine - Plan: Pfizer Comirnaty Covid -19 Vaccine 60yrs and older  Panic attacks - Plan: ALPRAZolam (XANAX) 0.25 MG tablet  Fibromyalgia muscle pain - Plan: cyclobenzaprine (FLEXERIL) 5 MG tablet  History of herpes genitalis - Plan: valACYclovir (VALTREX) 1000 MG tablet  Need for influenza vaccination - Plan: Flu vaccine trivalent PF, 6mos and older(Flulaval,Afluria,Fluarix,Fluzone)  Urinary urgency - Plan: POCT Urinalysis DIP (Proadvantage Device)  Acute cystitis without hematuria - Plan: nitrofurantoin, macrocrystal-monohydrate, (MACROBID) 100 MG capsule

## 2023-12-27 ENCOUNTER — Encounter: Payer: Self-pay | Admitting: Family Medicine

## 2023-12-27 ENCOUNTER — Other Ambulatory Visit: Payer: Self-pay | Admitting: Obstetrics and Gynecology

## 2023-12-27 DIAGNOSIS — Z1231 Encounter for screening mammogram for malignant neoplasm of breast: Secondary | ICD-10-CM

## 2023-12-28 DIAGNOSIS — Z419 Encounter for procedure for purposes other than remedying health state, unspecified: Secondary | ICD-10-CM | POA: Diagnosis not present

## 2024-01-08 ENCOUNTER — Ambulatory Visit
Admission: RE | Admit: 2024-01-08 | Discharge: 2024-01-08 | Disposition: A | Discharge status: Home / Self Care | Payer: Medicaid Other | Source: Ambulatory Visit | Attending: Obstetrics and Gynecology | Admitting: Obstetrics and Gynecology

## 2024-01-08 DIAGNOSIS — Z1231 Encounter for screening mammogram for malignant neoplasm of breast: Secondary | ICD-10-CM

## 2024-01-21 ENCOUNTER — Other Ambulatory Visit: Payer: Self-pay | Admitting: Family Medicine

## 2024-01-21 ENCOUNTER — Encounter: Payer: Self-pay | Admitting: Internal Medicine

## 2024-01-21 DIAGNOSIS — F901 Attention-deficit hyperactivity disorder, predominantly hyperactive type: Secondary | ICD-10-CM

## 2024-01-21 NOTE — Telephone Encounter (Signed)
 Is this okay to refill?

## 2024-01-22 MED ORDER — AMPHETAMINE-DEXTROAMPHETAMINE 30 MG PO TABS: 30.0000 mg | ORAL_TABLET | Freq: Two times a day (BID) | ORAL | 0 refills | Status: DC

## 2024-01-25 DIAGNOSIS — Z419 Encounter for procedure for purposes other than remedying health state, unspecified: Secondary | ICD-10-CM | POA: Diagnosis not present

## 2024-01-26 ENCOUNTER — Other Ambulatory Visit: Payer: Self-pay | Admitting: Family Medicine

## 2024-01-26 DIAGNOSIS — Z8619 Personal history of other infectious and parasitic diseases: Secondary | ICD-10-CM

## 2024-01-27 ENCOUNTER — Encounter: Payer: Self-pay | Admitting: Family Medicine

## 2024-01-27 ENCOUNTER — Ambulatory Visit (INDEPENDENT_AMBULATORY_CARE_PROVIDER_SITE_OTHER): Payer: Medicaid Other | Admitting: Family Medicine

## 2024-01-27 VITALS — BP 120/84 | HR 84 | Ht 61.25 in | Wt 169.0 lb

## 2024-01-27 DIAGNOSIS — G43009 Migraine without aura, not intractable, without status migrainosus: Secondary | ICD-10-CM | POA: Diagnosis not present

## 2024-01-27 DIAGNOSIS — Z0000 Encounter for general adult medical examination without abnormal findings: Secondary | ICD-10-CM

## 2024-01-27 DIAGNOSIS — E7439 Other disorders of intestinal carbohydrate absorption: Secondary | ICD-10-CM

## 2024-01-27 DIAGNOSIS — F900 Attention-deficit hyperactivity disorder, predominantly inattentive type: Secondary | ICD-10-CM

## 2024-01-27 DIAGNOSIS — Z23 Encounter for immunization: Secondary | ICD-10-CM | POA: Diagnosis not present

## 2024-01-27 DIAGNOSIS — F410 Panic disorder [episodic paroxysmal anxiety] without agoraphobia: Secondary | ICD-10-CM | POA: Diagnosis not present

## 2024-01-27 DIAGNOSIS — E782 Mixed hyperlipidemia: Secondary | ICD-10-CM | POA: Diagnosis not present

## 2024-01-27 DIAGNOSIS — M797 Fibromyalgia: Secondary | ICD-10-CM | POA: Diagnosis not present

## 2024-01-27 DIAGNOSIS — F329 Major depressive disorder, single episode, unspecified: Secondary | ICD-10-CM | POA: Diagnosis not present

## 2024-01-27 MED ORDER — BELSOMRA 10 MG PO TABS: 10.0000 mg | ORAL_TABLET | Freq: Every evening | ORAL | 1 refills | Status: DC | PRN

## 2024-01-27 MED ORDER — SERTRALINE HCL 100 MG PO TABS: 100.0000 mg | ORAL_TABLET | Freq: Two times a day (BID) | ORAL | 3 refills | Status: DC

## 2024-01-27 MED ORDER — BUPROPION HCL 100 MG PO TABS: 100.0000 mg | ORAL_TABLET | Freq: Two times a day (BID) | ORAL | 1 refills | Status: DC

## 2024-01-27 MED ORDER — TRAMADOL HCL 50 MG PO TABS: 50.0000 mg | ORAL_TABLET | Freq: Four times a day (QID) | ORAL | 1 refills | Status: DC | PRN

## 2024-01-27 NOTE — Progress Notes (Signed)
 Complete physical exam  Patient: [name]   DOB: Sep 15, 1966   58 y.o. Female  MRN: [id number]  Subjective:      [name] is a 58 y.o. female who presents today for a complete physical exam. She reports consuming a general diet. Home exercise routine includes walking 0.5 hrs per days. She generally feels fairly well. She reports sleeping poorly. She does have additional problems to discuss today.  She also has noted memory issues.  These are unrelated to her taking her ADD medication.  The Ambien usually works for 5 to 6 hours requiring her to use it twice per day.  She has no withdrawal symptoms.  She also is taking Ambien which in the past has been fairly successful but she has noted that that sometimes is not helpful for sleep and is concerned about its effect on her memory.  She also complains of a 1 year history of difficulty with right sided scalp hypersensitivity.  She also states that sometimes her feet will tingle and burn.  In the past she was given Neurontin but is not sure why she was getting it.   Most recent fall risk assessment:    01/27/2024   12:54 PM  Fall Risk   Falls in the past year? 0  Number falls in past yr: 0  Injury with Fall? 0  Risk for fall due to : No Fall Risks  Follow up Falls evaluation completed     Most recent depression screenings:    01/27/2024   12:54 PM 04/03/2023    3:47 PM  PHQ 2/9 Scores  PHQ - 2 Score 5 2  PHQ- 9 Score 20 10        Patient Care Team: Ronnald Nian, MD as PCP - General (Family Medicine) Maxie Better, MD as Consulting Physician (Obstetrics and Gynecology)   Outpatient Medications Prior to Visit  Medication Sig Note   amphetamine-dextroamphetamine (ADDERALL) 30 MG tablet Take 1 tablet by mouth 2 (two) times daily.    cyclobenzaprine (FLEXERIL) 5 MG tablet Take 1 tablet (5 mg total) by mouth 2 (two) times daily as needed. for muscle spams 01/27/2024: 1 daily    valACYclovir (VALTREX) 1000  MG tablet 1 pill daily for 5 days per outbreak    zolpidem (AMBIEN CR) 12.5 MG CR tablet TAKE 1 TABLET BY MOUTH AT BEDTIME    ALPRAZolam (XANAX) 0.25 MG tablet Take 1 tablet (0.25 mg total) by mouth 2 (two) times daily as needed. for anxiety (Patient not taking: Reported on 01/27/2024) 01/27/2024: Prn    cholecalciferol (VITAMIN D) 1000 units tablet Take 2,000 Units by mouth daily. (Patient not taking: Reported on 01/27/2024)    gabapentin (NEURONTIN) 300 MG capsule Take 1 capsule (300 mg total) by mouth 2 (two) times daily. (Patient not taking: Reported on 01/17/2022)    nitrofurantoin, macrocrystal-monohydrate, (MACROBID) 100 MG capsule Take 1 capsule (100 mg total) by mouth 2 (two) times daily. (Patient not taking: Reported on 01/27/2024)    SUMAtriptan (IMITREX) 100 MG tablet TAKE ONE TABLET BY MOUTH EVERY 2 HOURS AS NEEDED FOR MIGRAINE/HEADACHE (Patient not taking: Reported on 01/27/2024)    [DISCONTINUED] sertraline (ZOLOFT) 100 MG tablet Take 1 tablet (100 mg total) by mouth 2 (two) times daily.    [DISCONTINUED] traMADol (ULTRAM) 50 MG tablet TAKE 1 TABLET BY MOUTH EVERY 6 HOURS AS NEEDED. FOR PAIN    No facility-administered medications prior to visit.    Review of Systems  All other  systems reviewed and are negative.  MMSE 29       Objective:     BP 120/84   Pulse 84   Ht 5' 1.25" (1.556 m)   Wt 169 lb (76.7 kg)   LMP 01/12/2013   BMI 31.67 kg/m    Physical Exam  Alert and in no distress. Tympanic membranes and canals are normal. Pharyngeal area is normal. Neck is supple without adenopathy or thyromegaly. Cardiac exam shows a regular sinus rhythm without murmurs or gallops. Lungs are clear to auscultation.       Assessment & Plan:    Routine general medical examination at a health care facility - Plan: CBC with Differential/Platelet, Comprehensive metabolic panel, Lipid panel  Attention deficit hyperactivity disorder (ADHD), predominantly inattentive type  Fibromyalgia  muscle pain - Plan: sertraline (ZOLOFT) 100 MG tablet, traMADol (ULTRAM) 50 MG tablet, Suvorexant (BELSOMRA) 10 MG TABS  Glucose intolerance  Mixed hyperlipidemia  Migraine without aura and without status migrainosus, not intractable  Panic attacks  Reactive depression - Plan: buPROPion (WELLBUTRIN) 100 MG tablet  Need for shingles vaccine - Plan: Varicella-zoster vaccine IM  Immunization History  Administered Date(s) Administered   Influenza Split 09/10/2011, 09/09/2012   Influenza, Seasonal, Injecte, Preservative Fre 12/24/2023   Influenza,inj,Quad PF,6+ Mos 08/06/2016, 01/17/2022   PFIZER Comirnaty(Gray Top)Covid-19 Tri-Sucrose Vaccine 04/05/2021   PFIZER(Purple Top)SARS-COV-2 Vaccination 01/28/2020, 02/23/2020, 08/30/2020   Pfizer Covid-19 Vaccine Bivalent Booster 24yrs & up 01/17/2022   Pfizer(Comirnaty)Fall Seasonal Vaccine 12 years and older 12/24/2023   Tdap 08/06/2016    Health Maintenance  Topic Date Due   HIV Screening  Never done   Zoster Vaccines- Shingrix (1 of 2) Never done   COVID-19 Vaccine (7 - 2024-25 season) 02/18/2024   Colonoscopy  12/29/2024   MAMMOGRAM  01/07/2026   DTaP/Tdap/Td (2 - Td or Tdap) 08/06/2026   INFLUENZA VACCINE  Completed   Hepatitis C Screening  Completed   HPV VACCINES  Aged Out    Discussed stopping the Ambien and switch to Belsomra.  Although her memory issues do not seem to be a big problem, I think switching would be worthwhile to see if we can eliminate 1 CNS related issue.  Since her PHQ score was 20 and she admits to being depressed I think adding a little Wellbutrin would be reasonable.  Continue on present dosing of Zoloft.  Apparently her daughter is on that as well as Zoloft and has done well on that medicine. Problem List Items Addressed This Visit     ADHD (attention deficit hyperactivity disorder)   Fibromyalgia muscle pain   Relevant Medications   sertraline (ZOLOFT) 100 MG tablet   traMADol (ULTRAM) 50 MG tablet    Suvorexant (BELSOMRA) 10 MG TABS   buPROPion (WELLBUTRIN) 100 MG tablet   Glucose intolerance   Hyperlipidemia   Migraine without aura and without status migrainosus, not intractable   Relevant Medications   sertraline (ZOLOFT) 100 MG tablet   traMADol (ULTRAM) 50 MG tablet   buPROPion (WELLBUTRIN) 100 MG tablet   Panic attacks   Relevant Medications   sertraline (ZOLOFT) 100 MG tablet   buPROPion (WELLBUTRIN) 100 MG tablet   Reactive depression   Relevant Medications   sertraline (ZOLOFT) 100 MG tablet   buPROPion (WELLBUTRIN) 100 MG tablet   Other Visit Diagnoses       Routine general medical examination at a health care facility    -  Primary   Relevant Orders   CBC with Differential/Platelet  Comprehensive metabolic panel   Lipid panel     Need for shingles vaccine       Relevant Orders   Varicella-zoster vaccine IM      Return in about 1 month (around 02/27/2024).     Sharlot Gowda, MD

## 2024-01-28 ENCOUNTER — Encounter: Payer: Self-pay | Admitting: Family Medicine

## 2024-01-28 LAB — COMPREHENSIVE METABOLIC PANEL
ALT: 14 IU/L (ref 0–32)
AST: 23 IU/L (ref 0–40)
Albumin: 4.2 g/dL (ref 3.8–4.9)
Alkaline Phosphatase: 105 IU/L (ref 44–121)
BUN/Creatinine Ratio: 22 (ref 9–23)
BUN: 15 mg/dL (ref 6–24)
Bilirubin Total: <0.2 mg/dL (ref 0.0–1.2)
CO2: 24 mmol/L (ref 20–29)
Calcium: 9.1 mg/dL (ref 8.7–10.2)
Chloride: 109 mmol/L — ABNORMAL HIGH (ref 96–106)
Creatinine, Ser: 0.67 mg/dL (ref 0.57–1.00)
Globulin, Total: 2.9 g/dL (ref 1.5–4.5)
Glucose: 84 mg/dL (ref 70–99)
Potassium: 4.1 mmol/L (ref 3.5–5.2)
Sodium: 146 mmol/L — ABNORMAL HIGH (ref 134–144)
Total Protein: 7.1 g/dL (ref 6.0–8.5)
eGFR: 101 mL/min/{1.73_m2} (ref >59)

## 2024-01-28 LAB — LIPID PANEL
Chol/HDL Ratio: 3.8 ratio (ref 0.0–4.4)
Cholesterol, Total: 286 mg/dL — ABNORMAL HIGH (ref 100–199)
HDL: 75 mg/dL (ref >39)
LDL Chol Calc (NIH): 201 mg/dL — ABNORMAL HIGH (ref 0–99)
Triglycerides: 68 mg/dL (ref 0–149)
VLDL Cholesterol Cal: 10 mg/dL (ref 5–40)

## 2024-01-28 LAB — CBC WITH DIFFERENTIAL/PLATELET
Basophils Absolute: 0 10*3/uL (ref 0.0–0.2)
Basos: 1 %
EOS (ABSOLUTE): 0.2 10*3/uL (ref 0.0–0.4)
Eos: 4 %
Hematocrit: 35.9 % (ref 34.0–46.6)
Hemoglobin: 11.5 g/dL (ref 11.1–15.9)
Immature Grans (Abs): 0 10*3/uL (ref 0.0–0.1)
Immature Granulocytes: 0 %
Lymphocytes Absolute: 1.7 10*3/uL (ref 0.7–3.1)
Lymphs: 40 %
MCH: 27.4 pg (ref 26.6–33.0)
MCHC: 32 g/dL (ref 31.5–35.7)
MCV: 86 fL (ref 79–97)
Monocytes Absolute: 0.4 10*3/uL (ref 0.1–0.9)
Monocytes: 10 %
Neutrophils Absolute: 2 10*3/uL (ref 1.4–7.0)
Neutrophils: 45 %
Platelets: 196 10*3/uL (ref 150–450)
RBC: 4.19 x10E6/uL (ref 3.77–5.28)
RDW: 13.5 % (ref 11.7–15.4)
WBC: 4.4 10*3/uL (ref 3.4–10.8)

## 2024-01-29 ENCOUNTER — Other Ambulatory Visit (HOSPITAL_COMMUNITY): Payer: Self-pay

## 2024-01-29 ENCOUNTER — Telehealth: Payer: Self-pay

## 2024-01-29 NOTE — Telephone Encounter (Signed)
 Pharmacy Patient Advocate Encounter   Received notification from CoverMyMeds that prior authorization for traMADol HCl 50MG  tablets is required/requested.   Insurance verification completed.   The patient is insured through [facility] [place] .   Per test claim: PA required; PA submitted to above mentioned insurance via CoverMyMeds Key/confirmation #/EOC (Key: [id number])    Status is pending

## 2024-01-30 ENCOUNTER — Other Ambulatory Visit (HOSPITAL_COMMUNITY): Payer: Self-pay

## 2024-01-30 NOTE — Telephone Encounter (Signed)
 Pharmacy Patient Advocate Encounter  Received notification from [facility] Medicaid that Prior Authorization for traMADol HCl 50MG  tablets  has been APPROVED from 3.3.25 to 8.30.25. Ran test claim, Copay is $4.00 for a month supply. This test claim was processed through [facility]- copay amounts may vary at other pharmacies due to pharmacy/plan contracts, or as the patient moves through the different stages of their insurance plan.   PA #/Case ID/Reference #: (Key: [id number])

## 2024-01-31 ENCOUNTER — Other Ambulatory Visit (HOSPITAL_COMMUNITY): Payer: Self-pay

## 2024-01-31 ENCOUNTER — Telehealth: Payer: Self-pay

## 2024-01-31 DIAGNOSIS — G4701 Insomnia due to medical condition: Secondary | ICD-10-CM

## 2024-01-31 NOTE — Telephone Encounter (Signed)
 Pharmacy Patient Advocate Encounter   Received notification from Patient Advice Request messages that prior authorization for Belsomra 10MG  tablets is required/requested.   Insurance verification completed.   The patient is insured through [facility] [place] .   Per test claim: PA required; PA submitted to above mentioned insurance via CoverMyMeds Key/confirmation #/EOC (Key: [id number])  Status is pending

## 2024-01-31 NOTE — Telephone Encounter (Signed)
 Pharmacy Patient Advocate Encounter  Received notification from [facility] Medicaid that Prior Authorization for Belsomra 10mg  has been DENIED.  Full denial letter will be uploaded to the media tab. See denial reason below.   INS wont cover for ''FIBROMYALGIA''

## 2024-02-03 NOTE — Telephone Encounter (Signed)
 Dr. Susann Givens this was denied because of diagnosis of Fibromyalgia,  does she have diagnosis of insomnia?  If so please add that diagnosis & send back to P.A. team to redo P.A.

## 2024-02-03 NOTE — Telephone Encounter (Signed)
 Please schedule visit per [facility]

## 2024-02-05 ENCOUNTER — Telehealth: Payer: Self-pay

## 2024-02-05 NOTE — Telephone Encounter (Signed)
 Update: [employer] requires pt to trial and fail (two) of the pref'd drugs.

## 2024-02-05 NOTE — Telephone Encounter (Signed)
 Please addend the chart notes as this is what it currently displays and I can attempt to resend

## 2024-02-05 NOTE — Telephone Encounter (Signed)
 No worries. I have found a chartnote and submitted with the different icd. I will update you the determination.

## 2024-02-05 NOTE — Telephone Encounter (Signed)
 Pharmacy Patient Advocate Encounter   Received notification from Physician's Office that prior authorization for [facility] is required/requested.   Insurance verification completed.   The patient is insured through [facility] [place] .   Per test claim: PA required; PA submitted to above mentioned insurance via CoverMyMeds Key/confirmation #/EOC (Key: [id number]) Status is pending

## 2024-02-06 ENCOUNTER — Other Ambulatory Visit: Payer: Self-pay | Admitting: Family Medicine

## 2024-02-06 DIAGNOSIS — F329 Major depressive disorder, single episode, unspecified: Secondary | ICD-10-CM

## 2024-02-10 ENCOUNTER — Other Ambulatory Visit: Payer: Self-pay | Admitting: Family Medicine

## 2024-02-10 DIAGNOSIS — M797 Fibromyalgia: Secondary | ICD-10-CM

## 2024-02-10 NOTE — Telephone Encounter (Signed)
 Copied from CRM [id number]. Topic: Clinical - Medication Refill >> Feb 10, 2024  2:21 PM Carlatta H wrote: Most Recent Primary Care Visit:  Provider: Ronnald Nian  Department: [facility]  Visit Type: PHYSICAL 45  Date: 01/27/2024  Medication: zolpidem (AMBIEN CR) 12.5 MG CR tablet [[id number]]  Has the patient contacted their pharmacy? Yes (Agent: If no, request that the patient contact the pharmacy for the refill. If patient does not wish to contact the pharmacy document the reason why and proceed with request.) (Agent: If yes, when and what did the pharmacy advise?)  Is this the correct pharmacy for this prescription? Yes If no, delete pharmacy and type the correct one.  This is the patient's preferred pharmacy:  [facility] [address] [place] - [address] [place] [address] Phone: [phone] Fax: [phone]  [id number] [place]  - [address] [place] ([place]) [place] [address] Phone: [phone] Fax: [phone]   Has the prescription been filled recently? No  Is the patient out of the medication? Yes  Has the patient been seen for an appointment in the last year OR does the patient have an upcoming appointment? Yes  Can we respond through MyChart? Yes  Agent: Please be advised that Rx refills may take up to 3 business days. We ask that you follow-up with your pharmacy.

## 2024-02-11 MED ORDER — ZOLPIDEM TARTRATE ER 12.5 MG PO TBCR: 12.5000 mg | EXTENDED_RELEASE_TABLET | Freq: Every day | ORAL | 0 refills | Status: DC

## 2024-02-11 NOTE — Telephone Encounter (Signed)
 Is this okay to refill?

## 2024-02-27 ENCOUNTER — Ambulatory Visit: Admitting: Family Medicine

## 2024-03-03 ENCOUNTER — Ambulatory Visit: Admitting: Family Medicine

## 2024-03-07 DIAGNOSIS — Z419 Encounter for procedure for purposes other than remedying health state, unspecified: Secondary | ICD-10-CM | POA: Diagnosis not present

## 2024-03-31 ENCOUNTER — Encounter: Payer: Self-pay | Admitting: Family Medicine

## 2024-03-31 ENCOUNTER — Ambulatory Visit: Admitting: Family Medicine

## 2024-03-31 VITALS — BP 120/82 | HR 101 | Wt 157.2 lb

## 2024-03-31 DIAGNOSIS — F901 Attention-deficit hyperactivity disorder, predominantly hyperactive type: Secondary | ICD-10-CM

## 2024-03-31 DIAGNOSIS — Z23 Encounter for immunization: Secondary | ICD-10-CM

## 2024-03-31 DIAGNOSIS — F329 Major depressive disorder, single episode, unspecified: Secondary | ICD-10-CM | POA: Diagnosis not present

## 2024-03-31 MED ORDER — BUPROPION HCL ER (SR) 150 MG PO TB12: 150.0000 mg | ORAL_TABLET | Freq: Two times a day (BID) | ORAL | 1 refills | Status: DC

## 2024-03-31 MED ORDER — AMPHETAMINE-DEXTROAMPHETAMINE 30 MG PO TABS: 30.0000 mg | ORAL_TABLET | Freq: Two times a day (BID) | ORAL | 0 refills | Status: DC

## 2024-03-31 NOTE — Progress Notes (Unsigned)
   Subjective:    Patient ID: ROCELYN TURSO, female    DOB: 1966/07/31, 58 y.o.   MRN: [id number]  HPI She is here for a recheck.  She is now taking Ambien  again as her insurance would not cover for her to get Belsomra .  She states that adding the Wellbutrin  to the regimen has made her at least 75% back to normal.  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 She also needs a refill on her Adderall.  Review of Systems     Objective:    Physical Exam Alert and in no distress.  PHQ 15       Assessment & Plan:  Reactive depression - Plan: buPROPion  (WELLBUTRIN  SR) 150 MG 12 hr tablet  Attention-deficit hyperactivity disorder, predominantly hyperactive type - Plan: amphetamine -dextroamphetamine  (ADDERALL) 30 MG tablet  Need for shingles vaccine - Plan: Varicella-zoster vaccine IM I complemented her on the improvement that she has made.  I will increase her Wellbutrin  to 150 twice daily.  Also recommend she get involved in counseling and did give her [facility] or [facility].  She is to leave me a message on MyChart in about a month to let me know how she is doing.

## 2024-03-31 NOTE — Patient Instructions (Addendum)
 Call [facility] [phone] or [facility] [phone] Leave a message on MyChart in a month

## 2024-04-01 DIAGNOSIS — F901 Attention-deficit hyperactivity disorder, predominantly hyperactive type: Secondary | ICD-10-CM | POA: Diagnosis not present

## 2024-04-01 DIAGNOSIS — Z23 Encounter for immunization: Secondary | ICD-10-CM

## 2024-04-01 DIAGNOSIS — F329 Major depressive disorder, single episode, unspecified: Secondary | ICD-10-CM | POA: Diagnosis not present

## 2024-04-06 DIAGNOSIS — Z419 Encounter for procedure for purposes other than remedying health state, unspecified: Secondary | ICD-10-CM | POA: Diagnosis not present

## 2024-04-09 ENCOUNTER — Other Ambulatory Visit

## 2024-04-22 ENCOUNTER — Other Ambulatory Visit: Payer: Self-pay | Admitting: Family Medicine

## 2024-04-22 DIAGNOSIS — F329 Major depressive disorder, single episode, unspecified: Secondary | ICD-10-CM

## 2024-04-30 ENCOUNTER — Other Ambulatory Visit: Payer: Self-pay | Admitting: Family Medicine

## 2024-04-30 DIAGNOSIS — M797 Fibromyalgia: Secondary | ICD-10-CM

## 2024-05-07 DIAGNOSIS — Z419 Encounter for procedure for purposes other than remedying health state, unspecified: Secondary | ICD-10-CM | POA: Diagnosis not present

## 2024-05-22 ENCOUNTER — Other Ambulatory Visit: Payer: Self-pay | Admitting: Family Medicine

## 2024-05-22 DIAGNOSIS — M797 Fibromyalgia: Secondary | ICD-10-CM

## 2024-06-06 DIAGNOSIS — Z419 Encounter for procedure for purposes other than remedying health state, unspecified: Secondary | ICD-10-CM | POA: Diagnosis not present

## 2024-06-11 DIAGNOSIS — M7989 Other specified soft tissue disorders: Secondary | ICD-10-CM | POA: Diagnosis not present

## 2024-06-11 DIAGNOSIS — I83893 Varicose veins of bilateral lower extremities with other complications: Secondary | ICD-10-CM | POA: Diagnosis not present

## 2024-06-11 DIAGNOSIS — M79604 Pain in right leg: Secondary | ICD-10-CM | POA: Diagnosis not present

## 2024-06-11 DIAGNOSIS — M79662 Pain in left lower leg: Secondary | ICD-10-CM | POA: Diagnosis not present

## 2024-06-11 DIAGNOSIS — I87393 Chronic venous hypertension (idiopathic) with other complications of bilateral lower extremity: Secondary | ICD-10-CM | POA: Diagnosis not present

## 2024-06-11 DIAGNOSIS — M79661 Pain in right lower leg: Secondary | ICD-10-CM | POA: Diagnosis not present

## 2024-06-15 ENCOUNTER — Encounter: Payer: Self-pay | Admitting: Family Medicine

## 2024-06-29 DIAGNOSIS — I872 Venous insufficiency (chronic) (peripheral): Secondary | ICD-10-CM | POA: Diagnosis not present

## 2024-07-06 DIAGNOSIS — I83812 Varicose veins of left lower extremities with pain: Secondary | ICD-10-CM | POA: Diagnosis not present

## 2024-07-06 DIAGNOSIS — Z09 Encounter for follow-up examination after completed treatment for conditions other than malignant neoplasm: Secondary | ICD-10-CM | POA: Diagnosis not present

## 2024-07-06 DIAGNOSIS — I83891 Varicose veins of right lower extremities with other complications: Secondary | ICD-10-CM | POA: Diagnosis not present

## 2024-07-07 DIAGNOSIS — Z419 Encounter for procedure for purposes other than remedying health state, unspecified: Secondary | ICD-10-CM | POA: Diagnosis not present

## 2024-07-07 DIAGNOSIS — I83892 Varicose veins of left lower extremities with other complications: Secondary | ICD-10-CM | POA: Diagnosis not present

## 2024-07-20 DIAGNOSIS — I83891 Varicose veins of right lower extremities with other complications: Secondary | ICD-10-CM | POA: Diagnosis not present

## 2024-07-20 DIAGNOSIS — I87391 Chronic venous hypertension (idiopathic) with other complications of right lower extremity: Secondary | ICD-10-CM | POA: Diagnosis not present

## 2024-07-21 DIAGNOSIS — I83892 Varicose veins of left lower extremities with other complications: Secondary | ICD-10-CM | POA: Diagnosis not present

## 2024-07-21 DIAGNOSIS — I87392 Chronic venous hypertension (idiopathic) with other complications of left lower extremity: Secondary | ICD-10-CM | POA: Diagnosis not present

## 2024-07-24 ENCOUNTER — Other Ambulatory Visit: Payer: Self-pay | Admitting: Family Medicine

## 2024-07-24 DIAGNOSIS — M797 Fibromyalgia: Secondary | ICD-10-CM

## 2024-07-28 ENCOUNTER — Other Ambulatory Visit: Payer: Self-pay | Admitting: Family Medicine

## 2024-07-28 DIAGNOSIS — M797 Fibromyalgia: Secondary | ICD-10-CM

## 2024-07-28 DIAGNOSIS — F901 Attention-deficit hyperactivity disorder, predominantly hyperactive type: Secondary | ICD-10-CM

## 2024-07-28 MED ORDER — AMPHETAMINE-DEXTROAMPHETAMINE 30 MG PO TABS: 30.0000 mg | ORAL_TABLET | Freq: Two times a day (BID) | ORAL | 0 refills | Status: DC

## 2024-07-28 MED ORDER — CYCLOBENZAPRINE HCL 5 MG PO TABS: 5.0000 mg | ORAL_TABLET | Freq: Two times a day (BID) | ORAL | 0 refills | Status: DC | PRN

## 2024-07-28 NOTE — Telephone Encounter (Signed)
 Copied from CRM #[id number]. Topic: Clinical - Medication Refill >> Jul 28, 2024  3:20 PM Fredrica W wrote: Medication:  amphetamine -dextroamphetamine  (ADDERALL) 30 MG tablet cyclobenzaprine  (FLEXERIL ) 5 MG tablet  Has the patient contacted their pharmacy? Yes (Agent: If no, request that the patient contact the pharmacy for the refill. If patient does not wish to contact the pharmacy document the reason why and proceed with request.) (Agent: If yes, when and what did the pharmacy advise?)  This is the patient's preferred pharmacy:  [id number] [place], [place] - [address] [place] [address] Phone: [phone] Fax: [phone]   Is this the correct pharmacy for this prescription? Yes If no, delete pharmacy and type the correct one.   Has the prescription been filled recently? No  Is the patient out of the medication? Yes  Has the patient been seen for an appointment in the last year OR does the patient have an upcoming appointment? Yes May  Can we respond through MyChart? No  Agent: Please be advised that Rx refills may take up to 3 business days. We ask that you follow-up with your pharmacy.

## 2024-08-05 DIAGNOSIS — I83811 Varicose veins of right lower extremities with pain: Secondary | ICD-10-CM | POA: Diagnosis not present

## 2024-08-05 DIAGNOSIS — M7989 Other specified soft tissue disorders: Secondary | ICD-10-CM | POA: Diagnosis not present

## 2024-08-05 DIAGNOSIS — I83891 Varicose veins of right lower extremities with other complications: Secondary | ICD-10-CM | POA: Diagnosis not present

## 2024-08-07 DIAGNOSIS — Z419 Encounter for procedure for purposes other than remedying health state, unspecified: Secondary | ICD-10-CM | POA: Diagnosis not present

## 2024-08-10 ENCOUNTER — Other Ambulatory Visit: Payer: Self-pay | Admitting: Family Medicine

## 2024-08-10 DIAGNOSIS — M797 Fibromyalgia: Secondary | ICD-10-CM

## 2024-08-10 MED ORDER — ZOLPIDEM TARTRATE ER 12.5 MG PO TBCR: 12.5000 mg | EXTENDED_RELEASE_TABLET | Freq: Every day | ORAL | 2 refills | Status: DC

## 2024-08-10 NOTE — Telephone Encounter (Signed)
 Copied from CRM [id number]. Topic: Clinical - Medication Refill >> Aug 10, 2024  3:46 PM Berwyn MATSU wrote: Medication: zolpidem  (AMBIEN  CR) 12.5 MG CR tablet   Has the patient contacted their pharmacy? Yes (Agent: If no, request that the patient contact the pharmacy for the refill. If patient does not wish to contact the pharmacy document the reason why and proceed with request.) (Agent: If yes, when and what did the pharmacy advise?)  This is the patient's preferred pharmacy:  [id number] [place], [place] - [address] [place] [address] Phone: [phone] Fax: [phone]    Is this the correct pharmacy for this prescription? Yes If no, delete pharmacy and type the correct one.   Has the prescription been filled recently? Yes  Is the patient out of the medication? Yes  Has the patient been seen for an appointment in the last year OR does the patient have an upcoming appointment? Yes  Can we respond through MyChart? Yes  Agent: Please be advised that Rx refills may take up to 3 business days. We ask that you follow-up with your pharmacy.

## 2024-08-12 ENCOUNTER — Telehealth: Payer: Self-pay

## 2024-08-12 ENCOUNTER — Other Ambulatory Visit (HOSPITAL_COMMUNITY): Payer: Self-pay

## 2024-08-12 NOTE — Telephone Encounter (Signed)
 Pharmacy Patient Advocate Encounter   Received notification from CoverMyMeds that prior authorization for Zolpidem  Tartrate ER 12.5MG  er tablets  is required/requested.   Insurance verification completed.   The patient is insured through [facility] [place]ICA[place] .   Per test claim: PA required; PA submitted to above mentioned insurance via Latent Key/confirmation #/EOC [id number] Status is pending

## 2024-08-13 NOTE — Telephone Encounter (Signed)
 I have called and spoke with the pts pref'd pharnacy. Per pharmacy the P/A request that was sent over was accidental and they are aware that the pts Insurance wont cover the Rx Zolpidem . The Rx was filled on a discount card and was last picked up on 9/18. Nothing further needed.

## 2024-08-26 DIAGNOSIS — I83892 Varicose veins of left lower extremities with other complications: Secondary | ICD-10-CM | POA: Diagnosis not present

## 2024-10-01 ENCOUNTER — Other Ambulatory Visit: Payer: Self-pay | Admitting: Family Medicine

## 2024-10-01 DIAGNOSIS — F901 Attention-deficit hyperactivity disorder, predominantly hyperactive type: Secondary | ICD-10-CM

## 2024-10-01 NOTE — Telephone Encounter (Unsigned)
 Copied from CRM #[id number]. Topic: Clinical - Medication Refill >> Oct 01, 2024  3:37 PM Wess RAMAN wrote: Medication: amphetamine -dextroamphetamine  (ADDERALL) 30 MG tablet   Has the patient contacted their pharmacy? Yes (Agent: If no, request that the patient contact the pharmacy for the refill. If patient does not wish to contact the pharmacy document the reason why and proceed with request.) (Agent: If yes, when and what did the pharmacy advise?)  This is the patient's preferred pharmacy:  [facility] [address] [place] - [address] [place] [address] Phone: [phone] Fax: [phone]  [facility] - [place] ([place]), [place] - [address] [place] ([place]) [place] [address] Phone: [phone] Fax: [phone]  Is this the correct pharmacy for this prescription? Yes If no, delete pharmacy and type the correct one.   Has the prescription been filled recently? Yes  Is the patient out of the medication? Yes  Has the patient been seen for an appointment in the last year OR does the patient have an upcoming appointment? Yes  Can we respond through MyChart? Yes  Agent: Please be advised that Rx refills may take up to 3 business days. We ask that you follow-up with your pharmacy.

## 2024-10-02 MED ORDER — AMPHETAMINE-DEXTROAMPHETAMINE 30 MG PO TABS: 30.0000 mg | ORAL_TABLET | Freq: Two times a day (BID) | ORAL | 0 refills | Status: DC

## 2024-10-07 DIAGNOSIS — Z419 Encounter for procedure for purposes other than remedying health state, unspecified: Secondary | ICD-10-CM | POA: Diagnosis not present

## 2024-11-06 DIAGNOSIS — Z419 Encounter for procedure for purposes other than remedying health state, unspecified: Secondary | ICD-10-CM | POA: Diagnosis not present

## 2024-11-16 ENCOUNTER — Other Ambulatory Visit: Payer: Self-pay | Admitting: Family Medicine

## 2024-11-16 DIAGNOSIS — M797 Fibromyalgia: Secondary | ICD-10-CM

## 2024-11-16 DIAGNOSIS — Z8619 Personal history of other infectious and parasitic diseases: Secondary | ICD-10-CM

## 2024-11-16 DIAGNOSIS — N3000 Acute cystitis without hematuria: Secondary | ICD-10-CM

## 2024-11-18 ENCOUNTER — Other Ambulatory Visit: Payer: Self-pay | Admitting: Family Medicine

## 2024-11-18 DIAGNOSIS — F901 Attention-deficit hyperactivity disorder, predominantly hyperactive type: Secondary | ICD-10-CM

## 2024-11-18 MED ORDER — AMPHETAMINE-DEXTROAMPHETAMINE 30 MG PO TABS: 30.0000 mg | ORAL_TABLET | Freq: Two times a day (BID) | ORAL | 0 refills | Status: DC

## 2024-11-18 NOTE — Telephone Encounter (Signed)
 Last appt:03/31/24

## 2024-11-18 NOTE — Telephone Encounter (Signed)
 Copied from CRM [id number]. Topic: Clinical - Medication Refill >> Nov 18, 2024 12:38 PM Rachelle R wrote: Medication: amphetamine -dextroamphetamine  (ADDERALL) 30 MG tablet  Has the patient contacted their pharmacy? Yes, call dr  This is the patient's preferred pharmacy:  [facility] [address] [place] - [address] [place] [address] Phone: [phone] Fax: [phone]  Is this the correct pharmacy for this prescription? Yes If no, delete pharmacy and type the correct one.   Has the prescription been filled recently? No  Is the patient out of the medication? Yes  Has the patient been seen for an appointment in the last year OR does the patient have an upcoming appointment? Yes  Can we respond through MyChart? Yes  Agent: Please be advised that Rx refills may take up to 3 business days. We ask that you follow-up with your pharmacy.

## 2024-11-28 ENCOUNTER — Other Ambulatory Visit: Payer: Self-pay | Admitting: Family Medicine

## 2024-11-28 DIAGNOSIS — F329 Major depressive disorder, single episode, unspecified: Secondary | ICD-10-CM

## 2024-12-17 ENCOUNTER — Telehealth: Payer: Self-pay

## 2024-12-17 DIAGNOSIS — M797 Fibromyalgia: Secondary | ICD-10-CM

## 2024-12-17 MED ORDER — ZOLPIDEM TARTRATE ER 12.5 MG PO TBCR: 12.5000 mg | EXTENDED_RELEASE_TABLET | Freq: Every evening | ORAL | 1 refills | Status: DC | PRN

## 2024-12-17 NOTE — Telephone Encounter (Signed)
 Copied from CRM #[id number]. Topic: Clinical - Medication Question >> Dec 17, 2024 10:03 AM Gustabo D wrote: Pt wants her prescription- zolpidem  (AMBIEN  CR) 12.5 MG CR tablet   [facility] [address] [address]

## 2024-12-17 NOTE — Addendum Note (Signed)
 Addended by: JOYCE NORLEEN BROCKS on: 12/17/2024 03:39 PM   Modules accepted: Orders

## 2024-12-18 ENCOUNTER — Telehealth: Payer: Self-pay

## 2024-12-18 DIAGNOSIS — M797 Fibromyalgia: Secondary | ICD-10-CM

## 2024-12-18 MED ORDER — ZOLPIDEM TARTRATE ER 12.5 MG PO TBCR: 12.5000 mg | EXTENDED_RELEASE_TABLET | Freq: Every evening | ORAL | 1 refills | Status: AC | PRN

## 2024-12-18 MED ORDER — ZOLPIDEM TARTRATE ER 12.5 MG PO TBCR: 12.5000 mg | EXTENDED_RELEASE_TABLET | Freq: Every evening | ORAL | 1 refills | Status: DC | PRN

## 2024-12-18 NOTE — Telephone Encounter (Signed)
 Dr. Joyce [facility] [address] in [place]. I changed the pharmacy and attempted to send and it printed in office. [facility] [address] [facility] [place]   Copied from CRM #[id number]. Topic: Clinical - Medication Question >> Dec 17, 2024 10:03 AM Gustabo D wrote: Pt wants her prescription- zolpidem  (AMBIEN  CR) 12.5 MG CR tablet   [facility] [address] [address] >> Dec 17, 2024  6:03 PM Hadassah PARAS wrote: Pt called in to f/u on req. [facility] but it was the wrong pharm. I advised PCP is still working on getting [facility] [address].

## 2024-12-18 NOTE — Telephone Encounter (Signed)
 Copied from CRM #[id number]. Topic: Clinical - Medication Question >> Dec 17, 2024 10:03 AM Gustabo D wrote: Pt wants her prescription- zolpidem  (AMBIEN  CR) 12.5 MG CR tablet   [facility] [address] [address]

## 2024-12-18 NOTE — Addendum Note (Signed)
 Addended by: JOYCE NORLEEN BROCKS on: 12/18/2024 04:25 PM   Modules accepted: Orders

## 2024-12-31 ENCOUNTER — Telehealth: Payer: Self-pay

## 2024-12-31 DIAGNOSIS — F901 Attention-deficit hyperactivity disorder, predominantly hyperactive type: Secondary | ICD-10-CM

## 2024-12-31 NOTE — Telephone Encounter (Signed)
 Copied from CRM [id number]. Topic: Clinical - Medication Refill >> Dec 31, 2024  2:25 PM Santiya F wrote: Medication: amphetamine -dextroamphetamine  (ADDERALL) 30 MG tablet [[id number]]  Has the patient contacted their pharmacy? Yes  (Agent: If yes, when and what did the pharmacy advise?) contact office   This is the patient's preferred pharmacy:  [id number] [address] [place], [place] - [address] [place] [address] Phone: [phone] Fax: [phone]  Is this the correct pharmacy for this prescription? Yes If no, delete pharmacy and type the correct one.   Has the prescription been filled recently? Yes  Is the patient out of the medication? Yes  Has the patient been seen for an appointment in the last year OR does the patient have an upcoming appointment? Yes  Can we respond through MyChart? No  Agent: Please be advised that Rx refills may take up to 3 business days. We ask that you follow-up with your pharmacy.

## 2025-01-01 MED ORDER — AMPHETAMINE-DEXTROAMPHETAMINE 30 MG PO TABS: 30.0000 mg | ORAL_TABLET | Freq: Two times a day (BID) | ORAL | 0 refills | Status: AC

## 2025-01-01 NOTE — Addendum Note (Signed)
 Addended by: JOYCE NORLEEN BROCKS on: 01/01/2025 04:41 PM   Modules accepted: Orders

## 2025-01-27 ENCOUNTER — Encounter: Payer: Self-pay | Admitting: Family Medicine
